# Patient Record
Sex: Female | Born: 1960
Health system: Southern US, Community
[De-identification: ages and names within clinical notes are randomized; demographics above are authoritative.]

## PROBLEM LIST (undated history)

## (undated) DIAGNOSIS — G4730 Sleep apnea, unspecified: Secondary | ICD-10-CM

## (undated) DIAGNOSIS — F419 Anxiety disorder, unspecified: Secondary | ICD-10-CM

## (undated) DIAGNOSIS — R51 Headache: Principal | ICD-10-CM

## (undated) DIAGNOSIS — T7840XA Allergy, unspecified, initial encounter: Secondary | ICD-10-CM

## (undated) DIAGNOSIS — G8929 Other chronic pain: Secondary | ICD-10-CM

## (undated) DIAGNOSIS — S329XXA Fracture of unspecified parts of lumbosacral spine and pelvis, initial encounter for closed fracture: Secondary | ICD-10-CM

## (undated) DIAGNOSIS — B019 Varicella without complication: Secondary | ICD-10-CM

## (undated) DIAGNOSIS — D702 Other drug-induced agranulocytosis: Secondary | ICD-10-CM

## (undated) DIAGNOSIS — S42009A Fracture of unspecified part of unspecified clavicle, initial encounter for closed fracture: Secondary | ICD-10-CM

## (undated) DIAGNOSIS — H6980 Other specified disorders of Eustachian tube, unspecified ear: Secondary | ICD-10-CM

## (undated) DIAGNOSIS — H6990 Unspecified Eustachian tube disorder, unspecified ear: Secondary | ICD-10-CM

## (undated) DIAGNOSIS — M791 Myalgia, unspecified site: Secondary | ICD-10-CM

## (undated) DIAGNOSIS — N951 Menopausal and female climacteric states: Secondary | ICD-10-CM

## (undated) HISTORY — DX: Fracture of unspecified part of unspecified clavicle, initial encounter for closed fracture: S42.009A

## (undated) HISTORY — DX: Other drug-induced agranulocytosis: D70.2

## (undated) HISTORY — DX: Anxiety disorder, unspecified: F41.9

## (undated) HISTORY — DX: Other chronic pain: G89.29

## (undated) HISTORY — DX: Other specified disorders of Eustachian tube, unspecified ear: H69.80

## (undated) HISTORY — DX: Fracture of unspecified parts of lumbosacral spine and pelvis, initial encounter for closed fracture: S32.9XXA

## (undated) HISTORY — DX: Myalgia, unspecified site: M79.10

## (undated) HISTORY — DX: Unspecified eustachian tube disorder, unspecified ear: H69.90

## (undated) HISTORY — PX: OTHER SURGICAL HISTORY: SHX169

## (undated) HISTORY — DX: Varicella without complication: B01.9

## (undated) HISTORY — DX: Sleep apnea, unspecified: G47.30

## (undated) HISTORY — PX: ANTERIOR CRUCIATE LIGAMENT REPAIR: SHX115

## (undated) HISTORY — DX: Allergy, unspecified, initial encounter: T78.40XA

## (undated) HISTORY — DX: Headache: R51

## (undated) HISTORY — DX: Menopausal and female climacteric states: N95.1

---

## 1999-09-07 ENCOUNTER — Ambulatory Visit (HOSPITAL_COMMUNITY): Admission: RE | Admit: 1999-09-07 | Discharge: 1999-09-07 | Payer: Self-pay | Admitting: Obstetrics and Gynecology

## 1999-12-22 ENCOUNTER — Encounter (INDEPENDENT_AMBULATORY_CARE_PROVIDER_SITE_OTHER): Payer: Self-pay

## 1999-12-22 ENCOUNTER — Ambulatory Visit (HOSPITAL_COMMUNITY): Admission: RE | Admit: 1999-12-22 | Discharge: 1999-12-22 | Payer: Self-pay | Admitting: Obstetrics and Gynecology

## 2000-10-26 ENCOUNTER — Other Ambulatory Visit: Admission: RE | Admit: 2000-10-26 | Discharge: 2000-10-26 | Payer: Self-pay | Admitting: Obstetrics and Gynecology

## 2003-09-10 ENCOUNTER — Other Ambulatory Visit: Admission: RE | Admit: 2003-09-10 | Discharge: 2003-09-10 | Payer: Self-pay | Admitting: Obstetrics and Gynecology

## 2003-09-16 ENCOUNTER — Encounter: Admission: RE | Admit: 2003-09-16 | Discharge: 2003-09-16 | Payer: Self-pay | Admitting: Obstetrics and Gynecology

## 2007-02-15 ENCOUNTER — Other Ambulatory Visit: Admission: RE | Admit: 2007-02-15 | Discharge: 2007-02-15 | Payer: Self-pay | Admitting: *Deleted

## 2008-08-18 ENCOUNTER — Encounter: Admission: RE | Admit: 2008-08-18 | Discharge: 2008-08-18 | Payer: Self-pay | Admitting: Obstetrics and Gynecology

## 2010-06-07 ENCOUNTER — Emergency Department (HOSPITAL_COMMUNITY): Admission: EM | Admit: 2010-06-07 | Discharge: 2010-06-07 | Payer: Self-pay | Admitting: Emergency Medicine

## 2010-10-23 ENCOUNTER — Encounter: Payer: Self-pay | Admitting: Obstetrics and Gynecology

## 2010-10-24 ENCOUNTER — Encounter: Payer: Self-pay | Admitting: Obstetrics and Gynecology

## 2011-02-18 NOTE — Op Note (Signed)
[facility]  Patient:    Diane Frye, Diane Frye                  MRN: [id number] Proc. Date: 12/22/99 Adm. Date:  [id number] Attending:  Frederich Balding                           Operative Report  PREOPERATIVE DIAGNOSIS:       Cyst of the left ovary.  POSTOPERATIVE DIAGNOSIS:      Cyst of the left ovary.  OPERATION:                    Laparoscopy with left ovarian cyst.  SURGEON:                      Juluis Mire, M.D.  ASSISTANT:  ANESTHESIA:                   General endotracheal anesthesia.  ESTIMATED BLOOD LOSS:         Minimal.  PACKS AND DRAINS:             None.  BLOOD REPLACED:               None.  COMPLICATIONS:                None.  INDICATIONS:                  Dictated in the history and physical.  DESCRIPTION OF PROCEDURE:     The patient was taken to the OR and placed in the  supine position.  After a satisfactory level of general endotracheal anesthesia was obtained, the patient was placed in the dorsal lithotomy position using the Allen stirrups.  Examination under anesthesia did reveal the uterus to be of normal size and shape.  There was a left adnexal fullness consistent with a known cyst.  A Hulka tenaculum was put in place and secured and the bladder was emptied by in-and-out catheterization.  The patient was draped out for laparoscopy.  A subumbilical incision was made with the knife.  The Veress needle was introduced into the abdominal cavity.  The abdomen was inflated with approximately 2 liters of carbon dioxide.  The operating laparoscope was then introduced.  There was no evidence of injury to adjacent organs.  A 5 mm trocar was put in place in the suprapubic area under direct visualization.  The appendix was normal.  The upper abdomen including liver, tip of the gallbladder, and both lateral gutters were clear.  The right tube and ovary were unremarkable.  The uterus was unremarkable. The cul-de-sac  was clear.  The left ovary was cystically enlarged.  On the outside we could see a larger cyst measuring 4 cm and the smaller cyst approximately 1 m in size.  There was no external excrescences or adhesions.  It appeared to be a  benign process.  It was interesting in that the majority of this cyst was against the antimesenteric border.  We brought the bipolar in and cauterized some of these superficial vessels.  We made an incision into the capsule of the ovary and tried to dissect the cyst free, but entered the cyst.  Clear fluid was obtained. Visualization of the internal aspect of the cyst lining revealed no evidence of  solid or excrescences.  At this point in time, we decided to excise part of  the  ovarian cortex along with the cyst and we did that including the smaller cyst and this was sent for pathological review.  We then were able to dissect the remaining of the cyst lining free and sent that for pathological review.  At the end of the procedure, the bipolar was brought in to bring about hemostasis.  With this we ad good hemostasis.  It seemed like the ovary was falling together and reapproximating nicely.  No active bleeding was noted.  Some irrigation was left in place to prevent adhesions.  Of note, we did put in a third trocar in the left lower quadrant after visualization of the epigastric vessels and this was used to help secure the ovary that was not mentioned earlier.  At this point in time we had ood hemostasis.  All trocars were removed.  The abdomen was deflated of carbon dioxide. Some irrigation was left in place.  The subumbilical incision was closed with interrupted subcuticulars of 4-0 Vicryl.  The suprapubic incision was closed with Steri-Strips.  The Hulka tenaculum was then removed.  The patient was taken out of the dorsal lithotomy position and once alert and extubated, she was transferred to the recovery room in good condition.  sponge, needle,  and instrument counts were correct by circulating nurse x 2. DD:  12/22/99 TD:  12/22/99 Job: [id number]

## 2011-04-19 ENCOUNTER — Ambulatory Visit: Payer: Self-pay | Admitting: Internal Medicine

## 2011-05-19 ENCOUNTER — Ambulatory Visit (INDEPENDENT_AMBULATORY_CARE_PROVIDER_SITE_OTHER): Payer: BC Managed Care – PPO | Admitting: Internal Medicine

## 2011-05-19 ENCOUNTER — Encounter: Payer: Self-pay | Admitting: Internal Medicine

## 2011-05-19 DIAGNOSIS — H6990 Unspecified Eustachian tube disorder, unspecified ear: Secondary | ICD-10-CM

## 2011-05-19 DIAGNOSIS — H6980 Other specified disorders of Eustachian tube, unspecified ear: Secondary | ICD-10-CM

## 2011-05-19 DIAGNOSIS — R51 Headache: Secondary | ICD-10-CM

## 2011-05-19 DIAGNOSIS — R519 Headache, unspecified: Secondary | ICD-10-CM

## 2011-05-19 DIAGNOSIS — M791 Myalgia, unspecified site: Secondary | ICD-10-CM

## 2011-05-19 DIAGNOSIS — N951 Menopausal and female climacteric states: Secondary | ICD-10-CM

## 2011-05-19 DIAGNOSIS — IMO0001 Reserved for inherently not codable concepts without codable children: Secondary | ICD-10-CM

## 2011-05-19 NOTE — Progress Notes (Signed)
Subjective:    Patient ID: Diane Frye, female    DOB: 1961-01-08, 50 y.o.   MRN: [id number]  HPI Diane Frye presents to establish for on-going continuity. She does suffer from depression and would like to change her anti-depressant. She is menopausal and she is suffering the climacteric. She does have a chronic sleep problem and there is a family h/o anxiety. She is the primary care-giver for a post-stroke patient, who was her fiance at the time of the event, leaving him with a right hemiplegia. He is heavy and she feels that she the pulling and moving him has lead to strain and injury: she has pain in right upper back, left lower back, buttock with radiation to the leg. She does follow with a chiropractor.   Past Medical History  Diagnosis Date  . Varicella   . Chronic headaches     2-3 times a month, frontal location, pressure like pain.  . Allergy     year round allergy with sinus drainage. Drugs of choice are decongestants  . Eustachian tube dysfunction     right ore than left  . Drug-induced leukopenia     antibiotic induced - 14 day hospitalization 1990's   Past Surgical History  Procedure Date  . Anterior cruciate ligament repair     right knee, secondary trauma w/ ACL repair at 50 y/o  . Fracture pelvis     age 33  . Ovarian cystectomy     left, laproscopically  . Gopo   . Closed reduction clavicle fracture    Family History  Problem Relation Age of Onset  . Hypertension Mother   . Hyperlipidemia Mother   . Arthritis Mother     ankle and hip  . Obesity Mother   . Heart disease Father     MI/CAD  . COPD Father     lung scarring/COPD  . Hypertension Father   . Hyperlipidemia Father   . Diabetes Father   . Heart disease Brother     MI/PCA-stent  . Hyperlipidemia Brother   . Hypertension Brother   . Obesity Brother   . Cancer Neg Hx    History   Social History  . Marital Status: Single    Spouse Name: N/A    Number of Children: N/A  . Years of  Education: N/A   Occupational History  . Not on file.   Social History Main Topics  . Smoking status: Never Smoker   . Smokeless tobacco: Never Used  . Alcohol Use: 1.0 oz/week    2 drink(s) per week  . Drug Use: No  . Sexually Active: No   Other Topics Concern  . Not on file   Social History Narrative   HSG. Never married. Work - Museum/gallery exhibitions officer, Pensions consultant in the past, Government social research officer. No history of physical or sexually abused. She has "fiance" living with her.       Review of Systems Review of Systems  Constitutional:  Positive forlow grade fever, no chills, activity change and unexpected weight change up 40 lbs  HEENT:  Negative for hearing loss, occasional ear pain, frequent congestion, neck stiffness and postnasal drip. Frequent sinus infections. Negative for sore throat or swallowing problems. Negative for dental complaints - partial bridge constructed.   Eyes: Negative for vision loss or change in visual acuity.  Respiratory: Negative for chest tightness and wheezing.   Cardiovascular: Negative for chest pain and palpitation. No decreased exercise tolerance Gastrointestinal:Positive change in bowel habit- less frequent  BMs. Positive for  bloating or gas. No reflux or indigestion Genitourinary: Negative for urgency, frequency, flank pain and difficulty urinating. No incontinence Musculoskeletal: Positive for myalgias, back pain, arthralgias. Will have hand pain with overuse along with weakness. NO gait problem but will have a limp.  Neurological: Positive  for light headedness which she associates with neck pain/strain. No tremors, weakness. Positive for  headaches.  Hematological: Negative for adenopathy.  Psychiatric/Behavioral: Negative for behavioral problems and dysphoric mood.       Objective:   Physical Exam Vitals reviewed - BP excellent, overweight Gen'l - heavyset white woman in no distress HEENT - C&S clear, PERRLA Neck- supple Pul - normal  respirations, lungs clear Cor - 2+ radial pulse, RRR MSK - MAE without limitation, normal gait.       Assessment & Plan:  In summary - a nic ewoman who presents to establish for care. She has many medical and musculo-skeletal issues. She will return in the near future for a more thorough evaluation and exam. No new medications at this point. She will obtain old labs and/or imaging studies for review prior to her next visit.

## 2011-05-22 ENCOUNTER — Encounter: Payer: Self-pay | Admitting: Internal Medicine

## 2011-05-22 DIAGNOSIS — N951 Menopausal and female climacteric states: Secondary | ICD-10-CM | POA: Insufficient documentation

## 2011-05-22 DIAGNOSIS — M791 Myalgia, unspecified site: Secondary | ICD-10-CM | POA: Insufficient documentation

## 2011-05-22 DIAGNOSIS — H6980 Other specified disorders of Eustachian tube, unspecified ear: Secondary | ICD-10-CM | POA: Insufficient documentation

## 2011-05-22 DIAGNOSIS — G8929 Other chronic pain: Secondary | ICD-10-CM | POA: Insufficient documentation

## 2011-05-22 NOTE — Assessment & Plan Note (Signed)
Patient with concern for symptoms of climacteric including malaise and fatigue. She has had a saliva analysis revealing estradiol in normal range, progesterone in norma range, testosterone normal range, DHEAS normal range and cortisol in normal range. She has an explanation sheet to accompany these labs. I have told her that all I can say is that these values are in normal range. She is encouraged to discuss hormonal issues with her gynecologist.  Plan - refer back to gyn           If she insists serum hormone levels, FSH, LH, estrogen and testosterone levels can be ordered but will need interpretation by gyn or endocrinologist.           For symptoms of hormone deprivation she can discuss replacement options with gyn or IM.

## 2011-05-22 NOTE — Assessment & Plan Note (Signed)
Diane Frye c/o muscle pain right neck, scapular, left arm and hand regions. 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 She does seen a Land.  Plan - full MSK exam at next visit.           May benefit from myofascial massage/PT

## 2011-08-31 ENCOUNTER — Other Ambulatory Visit: Payer: Self-pay | Admitting: Obstetrics and Gynecology

## 2011-08-31 DIAGNOSIS — R928 Other abnormal and inconclusive findings on diagnostic imaging of breast: Secondary | ICD-10-CM

## 2011-09-13 ENCOUNTER — Ambulatory Visit
Admission: RE | Admit: 2011-09-13 | Discharge: 2011-09-13 | Disposition: A | Discharge status: Home / Self Care | Payer: BC Managed Care – PPO | Source: Ambulatory Visit | Attending: Obstetrics and Gynecology | Admitting: Obstetrics and Gynecology

## 2011-09-13 DIAGNOSIS — R928 Other abnormal and inconclusive findings on diagnostic imaging of breast: Secondary | ICD-10-CM

## 2011-09-16 ENCOUNTER — Other Ambulatory Visit: Payer: Self-pay | Admitting: Obstetrics and Gynecology

## 2011-09-16 DIAGNOSIS — N6489 Other specified disorders of breast: Secondary | ICD-10-CM

## 2011-09-16 DIAGNOSIS — R928 Other abnormal and inconclusive findings on diagnostic imaging of breast: Secondary | ICD-10-CM

## 2011-09-24 ENCOUNTER — Inpatient Hospital Stay: Admission: RE | Admit: 2011-09-24 | Payer: BC Managed Care – PPO | Source: Ambulatory Visit

## 2011-09-24 ENCOUNTER — Ambulatory Visit
Admission: RE | Admit: 2011-09-24 | Discharge: 2011-09-24 | Disposition: A | Discharge status: Home / Self Care | Payer: BC Managed Care – PPO | Source: Ambulatory Visit | Attending: Obstetrics and Gynecology | Admitting: Obstetrics and Gynecology

## 2011-09-24 DIAGNOSIS — N6489 Other specified disorders of breast: Secondary | ICD-10-CM

## 2011-09-24 DIAGNOSIS — R928 Other abnormal and inconclusive findings on diagnostic imaging of breast: Secondary | ICD-10-CM

## 2011-09-24 MED ORDER — GADOBENATE DIMEGLUMINE 529 MG/ML IV SOLN
15.0000 mL | Freq: Once | INTRAVENOUS | Status: AC | PRN
  Administered 2011-09-24: 15 mL via INTRAVENOUS

## 2011-09-28 ENCOUNTER — Other Ambulatory Visit: Payer: BC Managed Care – PPO

## 2011-09-30 ENCOUNTER — Other Ambulatory Visit: Payer: Self-pay | Admitting: Neurosurgery

## 2011-09-30 DIAGNOSIS — M545 Low back pain: Secondary | ICD-10-CM

## 2011-10-01 ENCOUNTER — Ambulatory Visit
Admission: RE | Admit: 2011-10-01 | Discharge: 2011-10-01 | Disposition: A | Discharge status: Home / Self Care | Payer: BC Managed Care – PPO | Source: Ambulatory Visit | Attending: Neurosurgery | Admitting: Neurosurgery

## 2011-10-01 DIAGNOSIS — M545 Low back pain: Secondary | ICD-10-CM

## 2012-04-10 ENCOUNTER — Other Ambulatory Visit: Payer: Self-pay | Admitting: Obstetrics and Gynecology

## 2012-04-10 DIAGNOSIS — N6009 Solitary cyst of unspecified breast: Secondary | ICD-10-CM

## 2012-08-24 ENCOUNTER — Other Ambulatory Visit: Payer: Self-pay | Admitting: Obstetrics and Gynecology

## 2012-08-24 ENCOUNTER — Ambulatory Visit
Admission: RE | Admit: 2012-08-24 | Discharge: 2012-08-24 | Disposition: A | Discharge status: Home / Self Care | Payer: BC Managed Care – PPO | Source: Ambulatory Visit | Attending: Obstetrics and Gynecology | Admitting: Obstetrics and Gynecology

## 2012-08-24 DIAGNOSIS — N6009 Solitary cyst of unspecified breast: Secondary | ICD-10-CM

## 2012-09-28 ENCOUNTER — Encounter (HOSPITAL_COMMUNITY): Payer: Self-pay | Admitting: *Deleted

## 2012-10-01 ENCOUNTER — Encounter (HOSPITAL_COMMUNITY): Payer: Self-pay | Admitting: Pharmacist

## 2012-10-01 NOTE — H&P (Signed)
  51 yo postmenopausal patient presents for LEEP and or conization of cervix.  Colposcopically directed biopsies revealed sever dysplasia.  colpo not satisfactory. Now for above.  Allergies:  codeine and penicillins Meds:  See list PMH:  Hypertension under management.           No Surgery noted  SH:  No tobacco or alcohol use  FH:  [place]  ROS:  [place]  PE:AF VSS       Lungs clear       CVS:  RRR no murmurs       Abdomin benign       Pelvic normal ext genitalia  Vagina clear  Cervix with nabothian cyst       Uterus NSSC adnexa clear        Neuro WNL  IMP:  High grade dysplasia of crevix  Plan:  LEEP or possible conization of the cerivx           Risks discussed including infection.  The risk of hemorrhage that could require transfusion with             Risk of AIDS and hepatitis.    Excessive bleeding could require hysterectomy.  Risk of DVT and             PE,   Risk of cervical stenosis.

## 2012-10-02 ENCOUNTER — Encounter (HOSPITAL_COMMUNITY)
Admission: RE | Disposition: A | Discharge status: Home / Self Care | Payer: Self-pay | Source: Ambulatory Visit | Attending: Obstetrics and Gynecology

## 2012-10-02 ENCOUNTER — Ambulatory Visit (HOSPITAL_COMMUNITY): Payer: BC Managed Care – PPO | Admitting: Anesthesiology

## 2012-10-02 ENCOUNTER — Encounter (HOSPITAL_COMMUNITY): Payer: Self-pay | Admitting: *Deleted

## 2012-10-02 ENCOUNTER — Encounter (HOSPITAL_COMMUNITY): Payer: Self-pay | Admitting: Anesthesiology

## 2012-10-02 ENCOUNTER — Ambulatory Visit (HOSPITAL_COMMUNITY)
Admission: RE | Admit: 2012-10-02 | Discharge: 2012-10-02 | Disposition: A | Discharge status: Home / Self Care | Payer: BC Managed Care – PPO | Source: Ambulatory Visit | Attending: Obstetrics and Gynecology | Admitting: Obstetrics and Gynecology

## 2012-10-02 DIAGNOSIS — N879 Dysplasia of cervix uteri, unspecified: Secondary | ICD-10-CM | POA: Diagnosis present

## 2012-10-02 DIAGNOSIS — Z9889 Other specified postprocedural states: Secondary | ICD-10-CM

## 2012-10-02 DIAGNOSIS — D069 Carcinoma in situ of cervix, unspecified: Secondary | ICD-10-CM | POA: Insufficient documentation

## 2012-10-02 DIAGNOSIS — N841 Polyp of cervix uteri: Secondary | ICD-10-CM | POA: Insufficient documentation

## 2012-10-02 HISTORY — PX: LEEP: SHX91

## 2012-10-02 LAB — CBC
HCT: 39 % (ref 36.0–46.0)
Hemoglobin: 12.8 g/dL (ref 12.0–15.0)
MCH: 28.3 pg (ref 26.0–34.0)
MCV: 86.1 fL (ref 78.0–100.0)
RBC: 4.53 MIL/uL (ref 3.87–5.11)

## 2012-10-02 LAB — HCG, SERUM, QUALITATIVE: Preg, Serum: NEGATIVE

## 2012-10-02 SURGERY — LEEP (LOOP ELECTROSURGICAL EXCISION PROCEDURE)
Anesthesia: Monitor Anesthesia Care | Site: Vagina | Wound class: Clean Contaminated

## 2012-10-02 MED ORDER — DEXAMETHASONE SODIUM PHOSPHATE 4 MG/ML IJ SOLN
INTRAMUSCULAR | Status: DC | PRN
  Administered 2012-10-02: 10 mg via INTRAVENOUS

## 2012-10-02 MED ORDER — FENTANYL CITRATE 0.05 MG/ML IJ SOLN
INTRAMUSCULAR | Status: AC
  Administered 2012-10-02: 50 ug via INTRAVENOUS
  Filled 2012-10-02: qty 2

## 2012-10-02 MED ORDER — FENTANYL CITRATE 0.05 MG/ML IJ SOLN: 25.0000 ug | INTRAMUSCULAR | Status: DC | PRN

## 2012-10-02 MED ORDER — KETOROLAC TROMETHAMINE 30 MG/ML IJ SOLN
INTRAMUSCULAR | Status: DC | PRN
  Administered 2012-10-02: 30 mg via INTRAVENOUS

## 2012-10-02 MED ORDER — DEXAMETHASONE SODIUM PHOSPHATE 10 MG/ML IJ SOLN
INTRAMUSCULAR | Status: AC
  Filled 2012-10-02: qty 1

## 2012-10-02 MED ORDER — ONDANSETRON HCL 4 MG/2ML IJ SOLN
INTRAMUSCULAR | Status: DC | PRN
  Administered 2012-10-02: 4 mg via INTRAVENOUS

## 2012-10-02 MED ORDER — FERRIC SUBSULFATE SOLN
Status: DC | PRN
  Administered 2012-10-02: 1 via TOPICAL

## 2012-10-02 MED ORDER — CLINDAMYCIN PHOSPHATE 900 MG/50ML IV SOLN
900.0000 mg | INTRAVENOUS | Status: AC
  Administered 2012-10-02: 900 mg via INTRAVENOUS
  Filled 2012-10-02: qty 50

## 2012-10-02 MED ORDER — CIPROFLOXACIN IN D5W 400 MG/200ML IV SOLN
400.0000 mg | INTRAVENOUS | Status: AC
  Administered 2012-10-02: 400 mg via INTRAVENOUS
  Filled 2012-10-02: qty 200

## 2012-10-02 MED ORDER — ONDANSETRON HCL 4 MG/2ML IJ SOLN
INTRAMUSCULAR | Status: AC
  Filled 2012-10-02: qty 2

## 2012-10-02 MED ORDER — FENTANYL CITRATE 0.05 MG/ML IJ SOLN
25.0000 ug | INTRAMUSCULAR | Status: DC | PRN
  Administered 2012-10-02 (×3): 50 ug via INTRAVENOUS

## 2012-10-02 MED ORDER — DIPHENHYDRAMINE HCL 50 MG/ML IJ SOLN
INTRAMUSCULAR | Status: AC
  Administered 2012-10-02: 25 mg via INTRAVENOUS
  Filled 2012-10-02: qty 1

## 2012-10-02 MED ORDER — PROMETHAZINE HCL 25 MG/ML IJ SOLN
12.5000 mg | Freq: Once | INTRAMUSCULAR | Status: AC
  Administered 2012-10-02: 12.5 mg via INTRAVENOUS

## 2012-10-02 MED ORDER — FENTANYL CITRATE 0.05 MG/ML IJ SOLN
INTRAMUSCULAR | Status: DC | PRN
  Administered 2012-10-02 (×3): 50 ug via INTRAVENOUS
  Administered 2012-10-02: 100 ug via INTRAVENOUS

## 2012-10-02 MED ORDER — PROPOFOL 10 MG/ML IV EMUL
INTRAVENOUS | Status: DC | PRN
  Administered 2012-10-02: 170 mg via INTRAVENOUS

## 2012-10-02 MED ORDER — FENTANYL CITRATE 0.05 MG/ML IJ SOLN
INTRAMUSCULAR | Status: AC
  Filled 2012-10-02: qty 5

## 2012-10-02 MED ORDER — LIDOCAINE HCL (CARDIAC) 20 MG/ML IV SOLN
INTRAVENOUS | Status: AC
  Filled 2012-10-02: qty 5

## 2012-10-02 MED ORDER — MIDAZOLAM HCL 5 MG/5ML IJ SOLN
INTRAMUSCULAR | Status: DC | PRN
  Administered 2012-10-02: 2 mg via INTRAVENOUS

## 2012-10-02 MED ORDER — LIDOCAINE HCL (CARDIAC) 20 MG/ML IV SOLN
INTRAVENOUS | Status: DC | PRN
  Administered 2012-10-02: 60 mg via INTRAVENOUS

## 2012-10-02 MED ORDER — DIPHENHYDRAMINE HCL 50 MG/ML IJ SOLN
25.0000 mg | Freq: Once | INTRAMUSCULAR | Status: AC
  Administered 2012-10-02: 25 mg via INTRAVENOUS

## 2012-10-02 MED ORDER — PROPOFOL 10 MG/ML IV EMUL
INTRAVENOUS | Status: AC
  Filled 2012-10-02: qty 20

## 2012-10-02 MED ORDER — KETOROLAC TROMETHAMINE 30 MG/ML IJ SOLN
INTRAMUSCULAR | Status: AC
  Filled 2012-10-02: qty 1

## 2012-10-02 MED ORDER — LACTATED RINGERS IV SOLN
INTRAVENOUS | Status: DC
  Administered 2012-10-02 (×4): via INTRAVENOUS

## 2012-10-02 MED ORDER — PROMETHAZINE HCL 25 MG/ML IJ SOLN
INTRAMUSCULAR | Status: AC
  Administered 2012-10-02: 12.5 mg via INTRAVENOUS
  Filled 2012-10-02: qty 1

## 2012-10-02 MED ORDER — LIDOCAINE-EPINEPHRINE 1 %-1:100000 IJ SOLN
INTRAMUSCULAR | Status: DC | PRN
  Administered 2012-10-02: 18 mL

## 2012-10-02 MED ORDER — MIDAZOLAM HCL 2 MG/2ML IJ SOLN
INTRAMUSCULAR | Status: AC
  Filled 2012-10-02: qty 2

## 2012-10-02 SURGICAL SUPPLY — 31 items
APPLICATOR COTTON TIP 6IN STRL (MISCELLANEOUS) ×3 IMPLANT
BLADE SURG 11 STRL SS (BLADE) ×3 IMPLANT
CLOTH BEACON ORANGE TIMEOUT ST (SAFETY) ×3 IMPLANT
CONTAINER PREFILL 10% NBF 60ML (FORM) ×6 IMPLANT
COUNTER NEEDLE 1200 MAGNETIC (NEEDLE) ×3 IMPLANT
DRESSING TELFA 8X3 (GAUZE/BANDAGES/DRESSINGS) ×3 IMPLANT
ELECT LOOP LEEP RND 15X12 GRN (CUTTING LOOP) ×3
ELECT REM PT RETURN 9FT ADLT (ELECTROSURGICAL) ×3
ELECTRODE LOOP LP RND 15X12GRN (CUTTING LOOP) ×2 IMPLANT
ELECTRODE REM PT RTRN 9FT ADLT (ELECTROSURGICAL) ×2 IMPLANT
GAUZE SPONGE 4X4 16PLY XRAY LF (GAUZE/BANDAGES/DRESSINGS) IMPLANT
GLOVE BIO SURGEON STRL SZ7 (GLOVE) ×6 IMPLANT
GOWN STRL REIN XL XLG (GOWN DISPOSABLE) ×6 IMPLANT
HEMOSTAT SURGICEL 2X14 (HEMOSTASIS) IMPLANT
NEEDLE SPNL 22GX3.5 QUINCKE BK (NEEDLE) ×3 IMPLANT
NS IRRIG 1000ML POUR BTL (IV SOLUTION) IMPLANT
PACK VAGINAL MINOR WOMEN LF (CUSTOM PROCEDURE TRAY) ×3 IMPLANT
PAD OB MATERNITY 4.3X12.25 (PERSONAL CARE ITEMS) ×3 IMPLANT
PENCIL BUTTON HOLSTER BLD 10FT (ELECTRODE) ×3 IMPLANT
SCOPETTES 8  STERILE (MISCELLANEOUS) ×2
SCOPETTES 8 STERILE (MISCELLANEOUS) ×4 IMPLANT
SPONGE SURGIFOAM ABS GEL 12-7 (HEMOSTASIS) IMPLANT
SUT CHROMIC 1MO 4 18 CR8 (SUTURE) ×3 IMPLANT
SYR 20CC LL (SYRINGE) ×3 IMPLANT
SYR CONTROL 10ML LL (SYRINGE) ×3 IMPLANT
SYR TB 1ML 27GX1/2 SAFE (SYRINGE) ×2 IMPLANT
SYR TB 1ML 27GX1/2 SAFETY (SYRINGE) ×1
TOWEL OR 17X24 6PK STRL BLUE (TOWEL DISPOSABLE) ×6 IMPLANT
TUBING NON-CON 1/4 X 20 CONN (TUBING) ×3 IMPLANT
WATER STERILE IRR 1000ML POUR (IV SOLUTION) ×3 IMPLANT
YANKAUER SUCT BULB TIP NO VENT (SUCTIONS) ×3 IMPLANT

## 2012-10-02 NOTE — Transfer of Care (Signed)
Immediate Anesthesia Transfer of Care Note  Patient: Diane Frye  Procedure(s) Performed: Procedure(s) (LRB) with comments: LOOP ELECTROSURGICAL EXCISION PROCEDURE (LEEP) (N/A)  Patient Location: PACU  Anesthesia Type:General  Level of Consciousness: awake, alert , oriented and patient cooperative  Airway & Oxygen Therapy: Patient Spontanous Breathing and Patient connected to nasal cannula oxygen  Post-op Assessment: Report given to PACU RN and Post -op Vital signs reviewed and stable  Post vital signs: Reviewed and stable  Complications: No apparent anesthesia complications

## 2012-10-02 NOTE — H&P (Signed)
  History and physical exam unchanged 

## 2012-10-02 NOTE — Anesthesia Postprocedure Evaluation (Signed)
  Anesthesia Post-op Note  Patient: Diane Frye  Procedure(s) Performed: Procedure(s) (LRB) with comments: LOOP ELECTROSURGICAL EXCISION PROCEDURE (LEEP) (N/A)  Patient Location: PACU  Anesthesia Type:MAC  Level of Consciousness: awake, alert  and oriented  Airway and Oxygen Therapy: Patient Spontanous Breathing  Post-op Pain: none  Post-op Assessment: Post-op Vital signs reviewed, Patient's Cardiovascular Status Stable, Respiratory Function Stable, Patent Airway, No signs of Nausea or vomiting and Adequate PO intake  Post-op Vital Signs: Reviewed and stable  Complications: No apparent anesthesia complications

## 2012-10-02 NOTE — Preoperative (Signed)
Beta Blockers   Reason not to administer Beta Blockers:Not Applicable 

## 2012-10-02 NOTE — Anesthesia Preprocedure Evaluation (Signed)
Anesthesia Evaluation  Patient identified by MRN, date of birth, ID band Patient awake    Reviewed: Allergy & Precautions, H&P , Patient's Chart, lab work & pertinent test results, reviewed documented beta blocker date and time   Airway Mallampati: II TM Distance: >3 FB Neck ROM: full    Dental No notable dental hx.    Pulmonary  breath sounds clear to auscultation  Pulmonary exam normal       Cardiovascular Rhythm:regular Rate:Normal     Neuro/Psych    GI/Hepatic   Endo/Other    Renal/GU      Musculoskeletal   Abdominal   Peds  Hematology   Anesthesia Other Findings   Reproductive/Obstetrics                           Anesthesia Physical Anesthesia Plan  ASA: II  Anesthesia Plan: General and MAC   Post-op Pain Management:    Induction: Intravenous  Airway Management Planned: LMA and Natural Airway  Additional Equipment:   Intra-op Plan:   Post-operative Plan:   Informed Consent: I have reviewed the patients History and Physical, chart, labs and discussed the procedure including the risks, benefits and alternatives for the proposed anesthesia with the patient or authorized representative who has indicated his/her understanding and acceptance.   Dental Advisory Given  Plan Discussed with: CRNA and Surgeon  Anesthesia Plan Comments: (  Discussed  general anesthesia, including possible nausea, instrumentation of airway, sore throat,pulmonary aspiration, etc. I asked if the were any outstanding questions, or  concerns before we proceeded. )        Anesthesia Quick Evaluation

## 2012-10-02 NOTE — Brief Op Note (Signed)
10/02/2012  1:34 PM  PATIENT:  Diane Frye  51 y.o. female  PRE-OPERATIVE DIAGNOSIS:  high grade dysplasia  POST-OPERATIVE DIAGNOSIS:  high grade dysplasia  PROCEDURE:  Procedure(s) (LRB) with comments: LOOP ELECTROSURGICAL EXCISION PROCEDURE (LEEP) (N/A)  SURGEON:  Surgeon(s) and Role:    * Juluis Mire, MD - Primary  PHYSICIAN ASSISTANT:   ASSISTANTS: none   ANESTHESIA:   local and general  EBL:     BLOOD ADMINISTERED:none  DRAINS: none   LOCAL MEDICATIONS USED:  XYLOCAINE eiith epi  and Amount: 18 ml  SPECIMEN:  Source of Specimen:  cerivcal cone and endocervical button  DISPOSITION OF SPECIMEN:  PATHOLOGY  COUNTS:  YES  TOURNIQUET:  * No tourniquets in log *  DICTATION: .Other Dictation: Dictation Number [id number]  PLAN OF CARE: Discharge to home after PACU  PATIENT DISPOSITION:  PACU - hemodynamically stable.   Delay start of Pharmacological VTE agent (>24hrs) due to surgical blood loss or risk of bleeding: not applicable

## 2012-10-02 NOTE — Op Note (Signed)
Patient name  aydin, cavalieri DICTATION#  [id number] CSN# [id number]  Juluis Mire, MD 10/02/2012 1:35 PM

## 2012-10-03 ENCOUNTER — Encounter (HOSPITAL_COMMUNITY): Payer: Self-pay | Admitting: Obstetrics and Gynecology

## 2012-10-03 NOTE — Op Note (Signed)
NAME:  Diane Frye, Diane Frye NO.:  [id number]  MEDICAL RECORD NO.:  [id number]  LOCATION:  [facility]                          FACILITY:  [facility]  PHYSICIAN:  Juluis Mire, M.D.   DATE OF BIRTH:  05-15-1961  DATE OF PROCEDURE:  10/02/2012 DATE OF DISCHARGE:  10/02/2012                              OPERATIVE REPORT   PREOPERATIVE DIAGNOSIS:  Severe cervical dysplasia with unsatisfactory colposcopy.  POSTOPERATIVE DIAGNOSIS:  Severe cervical dysplasia with unsatisfactory colposcopy.  OPERATIVE PROCEDURE:  Paracervical block with loop electrical excision procedure.  SURGEON:  Juluis Mire, M.D.  ANESTHESIA:  General with paracervical block.  BLOOD LOSS:  Minimal.  PACKS:  None.  DRAINS:  None.  INTRAOPERATIVE BLOOD PLACED:  None.  COMPLICATIONS:  None.  INDICATIONS:  As dictated in the history and physical.  PROCEDURE:  The patient was taken to the OR and placed in supine position.  After satisfactory level of general anesthesia obtained, the patient was placed in dorsal lithotomy position in the  [place] stirrups. The perineum and vagina prepped out with Betadine and draped sterile field.  A speculum was placed in vaginal vault.  An apparent endocervical polyp was noted.  The cervix was instilled with 1% Xylocaine with epinephrine.  We used the middle loop.  We first excised the polyp and then we did a superficial excision of the ectocervix and endocervical.  All this was sent for pathology.  We brought out hemostasis using cautery and [place].  We had good hemostasis and no signs of any complications.  Speculum was then removed.  The patient was taken out of the supine position once alert and extubated, transferred to recovery in good condition.  Sponge, instrument, and needle count was correct by circulating nurse x2.  The patient tolerated procedure well.     Juluis Mire, M.D.     JSM/MEDQ  D:  10/02/2012  T:  10/03/2012  Job:  [id number]

## 2013-09-10 ENCOUNTER — Encounter: Payer: Self-pay | Admitting: Gastroenterology

## 2013-09-17 ENCOUNTER — Other Ambulatory Visit: Payer: Self-pay | Admitting: Obstetrics and Gynecology

## 2013-09-17 DIAGNOSIS — R928 Other abnormal and inconclusive findings on diagnostic imaging of breast: Secondary | ICD-10-CM

## 2013-09-27 ENCOUNTER — Other Ambulatory Visit: Payer: BC Managed Care – PPO

## 2013-10-01 ENCOUNTER — Other Ambulatory Visit: Payer: BC Managed Care – PPO

## 2013-10-02 ENCOUNTER — Ambulatory Visit
Admission: RE | Admit: 2013-10-02 | Discharge: 2013-10-02 | Disposition: A | Discharge status: Home / Self Care | Payer: BC Managed Care – PPO | Source: Ambulatory Visit | Attending: Obstetrics and Gynecology | Admitting: Obstetrics and Gynecology

## 2013-10-02 DIAGNOSIS — R928 Other abnormal and inconclusive findings on diagnostic imaging of breast: Secondary | ICD-10-CM

## 2013-10-04 ENCOUNTER — Other Ambulatory Visit: Payer: BC Managed Care – PPO

## 2013-11-06 ENCOUNTER — Encounter: Payer: BC Managed Care – PPO | Admitting: Gastroenterology

## 2015-02-25 ENCOUNTER — Other Ambulatory Visit: Payer: Self-pay | Admitting: Obstetrics and Gynecology

## 2015-02-26 LAB — CYTOLOGY - PAP

## 2015-10-26 ENCOUNTER — Emergency Department (HOSPITAL_COMMUNITY)
Admission: EM | Admit: 2015-10-26 | Discharge: 2015-10-27 | Disposition: A | Discharge status: Home / Self Care | Payer: Managed Care, Other (non HMO) | Attending: Emergency Medicine | Admitting: Emergency Medicine

## 2015-10-26 ENCOUNTER — Encounter (HOSPITAL_COMMUNITY): Payer: Self-pay | Admitting: Emergency Medicine

## 2015-10-26 ENCOUNTER — Emergency Department (HOSPITAL_COMMUNITY): Payer: Managed Care, Other (non HMO)

## 2015-10-26 DIAGNOSIS — R42 Dizziness and giddiness: Secondary | ICD-10-CM | POA: Insufficient documentation

## 2015-10-26 DIAGNOSIS — Z862 Personal history of diseases of the blood and blood-forming organs and certain disorders involving the immune mechanism: Secondary | ICD-10-CM | POA: Insufficient documentation

## 2015-10-26 DIAGNOSIS — Z8619 Personal history of other infectious and parasitic diseases: Secondary | ICD-10-CM | POA: Diagnosis not present

## 2015-10-26 DIAGNOSIS — G8929 Other chronic pain: Secondary | ICD-10-CM | POA: Diagnosis not present

## 2015-10-26 DIAGNOSIS — Z880 Allergy status to penicillin: Secondary | ICD-10-CM | POA: Diagnosis not present

## 2015-10-26 DIAGNOSIS — J0110 Acute frontal sinusitis, unspecified: Secondary | ICD-10-CM | POA: Insufficient documentation

## 2015-10-26 DIAGNOSIS — R1110 Vomiting, unspecified: Secondary | ICD-10-CM | POA: Insufficient documentation

## 2015-10-26 DIAGNOSIS — Z79899 Other long term (current) drug therapy: Secondary | ICD-10-CM | POA: Diagnosis not present

## 2015-10-26 DIAGNOSIS — Z8742 Personal history of other diseases of the female genital tract: Secondary | ICD-10-CM | POA: Diagnosis not present

## 2015-10-26 DIAGNOSIS — R202 Paresthesia of skin: Secondary | ICD-10-CM | POA: Diagnosis not present

## 2015-10-26 LAB — I-STAT TROPONIN, ED: TROPONIN I, POC: 0 ng/mL (ref 0.00–0.08)

## 2015-10-26 LAB — URINE MICROSCOPIC-ADD ON

## 2015-10-26 LAB — CBC
HEMATOCRIT: 37.7 % (ref 36.0–46.0)
HEMOGLOBIN: 12.4 g/dL (ref 12.0–15.0)
MCH: 29.4 pg (ref 26.0–34.0)
MCHC: 32.9 g/dL (ref 30.0–36.0)
MCV: 89.3 fL (ref 78.0–100.0)
Platelets: 394 10*3/uL (ref 150–400)
RBC: 4.22 MIL/uL (ref 3.87–5.11)
RDW: 13.6 % (ref 11.5–15.5)
WBC: 9.7 10*3/uL (ref 4.0–10.5)

## 2015-10-26 LAB — LIPASE, BLOOD: Lipase: 32 U/L (ref 11–51)

## 2015-10-26 LAB — COMPREHENSIVE METABOLIC PANEL
ALBUMIN: 4.6 g/dL (ref 3.5–5.0)
ALK PHOS: 73 U/L (ref 38–126)
ALT: 12 U/L — ABNORMAL LOW (ref 14–54)
ANION GAP: 9 (ref 5–15)
AST: 17 U/L (ref 15–41)
BILIRUBIN TOTAL: 0.4 mg/dL (ref 0.3–1.2)
BUN: 23 mg/dL — AB (ref 6–20)
CALCIUM: 9.8 mg/dL (ref 8.9–10.3)
CO2: 23 mmol/L (ref 22–32)
Chloride: 107 mmol/L (ref 101–111)
Creatinine, Ser: 0.71 mg/dL (ref 0.44–1.00)
GFR calc Af Amer: >60 mL/min (ref >60)
GFR calc non Af Amer: >60 mL/min (ref >60)
GLUCOSE: 118 mg/dL — AB (ref 65–99)
Potassium: 4.4 mmol/L (ref 3.5–5.1)
SODIUM: 139 mmol/L (ref 135–145)
Total Protein: 7.8 g/dL (ref 6.5–8.1)

## 2015-10-26 LAB — URINALYSIS, ROUTINE W REFLEX MICROSCOPIC
BILIRUBIN URINE: NEGATIVE
Glucose, UA: NEGATIVE mg/dL
KETONES UR: NEGATIVE mg/dL
Nitrite: NEGATIVE
PH: 7.5 (ref 5.0–8.0)
Protein, ur: NEGATIVE mg/dL
SPECIFIC GRAVITY, URINE: 1.009 (ref 1.005–1.030)

## 2015-10-26 MED ORDER — AMOXICILLIN-POT CLAVULANATE 875-125 MG PO TABS: 1.0000 | ORAL_TABLET | Freq: Two times a day (BID) | ORAL | Status: DC

## 2015-10-26 MED ORDER — SODIUM CHLORIDE 0.9 % IV BOLUS (SEPSIS)
1000.0000 mL | Freq: Once | INTRAVENOUS | Status: AC
  Administered 2015-10-26: 1000 mL via INTRAVENOUS

## 2015-10-26 MED ORDER — FLUORESCEIN SODIUM 1 MG OP STRP
1.0000 | ORAL_STRIP | Freq: Once | OPHTHALMIC | Status: AC
  Administered 2015-10-26: 1 via OPHTHALMIC
  Filled 2015-10-26: qty 1

## 2015-10-26 MED ORDER — TETRACAINE HCL 0.5 % OP SOLN
1.0000 [drp] | Freq: Once | OPHTHALMIC | Status: AC
  Administered 2015-10-26: 1 [drp] via OPHTHALMIC
  Filled 2015-10-26: qty 4

## 2015-10-26 NOTE — Discharge Instructions (Signed)
Please follow-up with her primary care provider for further evaluation and management. If any new or worsening signs or symptoms present please return to the emergency room for further evaluation.

## 2015-10-26 NOTE — ED Notes (Signed)
Pt c/o dizziness, emesis, and abdominal pain x 4 days. Pt sts "it started with the flu in my chest and now I seem to be over with that but I'm vomiting and have pain on the L side of my stomach." Pt also c/o sciatica in L hip. Pt also sts "I have heart problems on my moms side." Pt sts "looking at my symptoms it could be the flu with a heart problem or anxiety with the flu." Pt also c/o dry mouth. A&Ox4 and ambulatory.

## 2015-10-26 NOTE — ED Provider Notes (Signed)
CSN: [id number]     Arrival date & time 10/26/15  1505 History   First MD Initiated Contact with Patient 10/26/15 1652     Chief Complaint  Patient presents with  . Emesis  . Abdominal Pain  . Dizziness   HPI   55 year old female presents with numerous complaints today. Patient reports that approximately 2 weeks ago she had what she described as the flu with upper respiratory symptoms, cough. She reports that she seemed to be getting better but over the last few days started feeling worse again. She reports that she is here today because she had pressure behind her right eye, she denies headache, reports this is a "pulling sensation. She reports that this morning after attempting to vomit she felt "dizzy as if she was going to pass out. She reports dizziness has persisted, with the addition of cotton mouth. Patient reports she also had an episode of tingling in her left arm that has since resolved. Patient also reports that she's having left sciatic pain, reports this is chronic. Patient notes blurry vision out of her right eye, but denies decrease in visual acuity. Patient also reports that she's feeling "sick on her stomach," with 5 episodes of vomiting today and loose stools but no profuse diarrhea. Patient additionally notes a productive cough with "brown sputum and ". Patient denies any history of DVT or PE, lower extremity swelling or edema, recent prolonged immobilization, active malignancy, or any other risk factors for DVT or PE.  Pt continues to add complaints after further questioning- see reports the vision is not equal on both sides and decreased on the right. She reports it feels as if something is stuck in it. She states these symptoms started before all over the above.   Patient's sister is at bedside who continues to diagnose patient telling her what she bleeds it is. When asked what patient believes happened today she reports that she feels she had a panic attack but did not have  increased respirations. But states that "I know the dry mouth and tingling in the arm could be TIA or stroke"  her sister the bedside reports that patient appears to have a droop to her face that she did not notice before.    Past Medical History  Diagnosis Date  . Varicella   . Chronic headaches     2-3 times a month, frontal location, pressure like pain.  . Allergy     year round allergy with sinus drainage. Drugs of choice are decongestants  . Eustachian tube dysfunction     right ore than left  . Drug-induced leukopenia (HCC)     antibiotic induced - 14 day hospitalization 1990's  . Climacteric   . Myalgia    Past Surgical History  Procedure Laterality Date  . Anterior cruciate ligament repair      right knee, secondary trauma w/ ACL repair at 55 y/o  . Fracture pelvis      age 28  . Ovarian cystectomy      left, laproscopically  . Gopo    . Closed reduction clavicle fracture    . Leep  10/02/2012    Procedure: LOOP ELECTROSURGICAL EXCISION PROCEDURE (LEEP);  Surgeon: Darlyn Chamber, MD;  Location: [facility];  Service: Gynecology;  Laterality: N/A;   Family History  Problem Relation Age of Onset  . Hypertension Mother   . Hyperlipidemia Mother   . Arthritis Mother     ankle and hip  . Obesity Mother   .  Heart disease Father     MI/CAD  . COPD Father     lung scarring/COPD  . Hypertension Father   . Hyperlipidemia Father   . Diabetes Father   . Heart disease Brother     MI/PCA-stent  . Hyperlipidemia Brother   . Hypertension Brother   . Obesity Brother   . Cancer Neg Hx    Social History  Substance Use Topics  . Smoking status: Never Smoker   . Smokeless tobacco: Never Used  . Alcohol Use: 1.0 oz/week    2 drink(s) per week   OB History    No data available     Review of Systems  All other systems reviewed and are negative.   Allergies  Codeine and Penicillins  Home Medications   Prior to Admission medications   Medication Sig Start Date End  Date Taking? Authorizing Provider  ALPRAZolam Duanne Moron) 1 MG tablet Take 1 mg by mouth daily. 10/06/15  Yes Historical Provider, MD  ibuprofen (ADVIL,MOTRIN) 200 MG tablet Take 800 mg by mouth every 8 (eight) hours as needed. For back pain.   Yes Historical Provider, MD  levocetirizine (XYZAL) 5 MG tablet Take 5 mg by mouth daily as needed for allergies.    Yes Historical Provider, MD  naproxen sodium (ANAPROX) 220 MG tablet Take 440 mg by mouth once as needed (Pain). Reported on 10/26/2015   Yes Historical Provider, MD  triamterene-hydrochlorothiazide (MAXZIDE-25) 37.5-25 MG per tablet Take 1 tablet by mouth as needed. For diuresis/ edema.   Yes Historical Provider, MD  zolpidem (AMBIEN CR) 12.5 MG CR tablet Take 12.5 mg by mouth at bedtime as needed for sleep.    Yes Historical Provider, MD  amoxicillin-clavulanate (AUGMENTIN) 875-125 MG tablet Take 1 tablet by mouth 2 (two) times daily. 10/26/15   Parilee Hally, PA-C   BP 131/87 mmHg  Pulse 68  Temp(Src) 98.6 F (37 C) (Oral)  Resp 19  SpO2 100%   Physical Exam  Constitutional: She is oriented to person, place, and time. She appears well-developed and well-nourished.  HENT:  Head: Normocephalic and atraumatic.  Right Ear: Hearing, tympanic membrane, external ear and ear canal normal.  Left Ear: Hearing, tympanic membrane, external ear and ear canal normal.  Eyes: Conjunctivae are normal. Pupils are equal, round, and reactive to light. Right eye exhibits no discharge. Left eye exhibits no discharge. No scleral icterus.  Fluorescein stain of right eye shows no foreign body, abrasion, or ulceration no abnormalities noted  Neck: Normal range of motion. No JVD present. No tracheal deviation present.  Cardiovascular: Normal rate, regular rhythm, normal heart sounds and intact distal pulses.  Exam reveals no gallop and no friction rub.   No murmur heard. Pulmonary/Chest: Effort normal and breath sounds normal. No stridor. No respiratory distress.  She has no wheezes. She has no rales. She exhibits no tenderness.  Abdominal: Bowel sounds are normal. She exhibits no mass. There is no tenderness. There is no rebound.  Musculoskeletal: Normal range of motion. She exhibits no edema or tenderness.  Neurological: She is alert and oriented to person, place, and time. She has normal strength. No cranial nerve deficit or sensory deficit. Coordination and gait normal. GCS eye subscore is 4. GCS verbal subscore is 5. GCS motor subscore is 6.  Reflex Scores:      Patellar reflexes are 2+ on the right side and 2+ on the left side. Skin: Skin is warm and dry. No rash noted. No erythema. No pallor.  Psychiatric:  She has a normal mood and affect. Her behavior is normal. Judgment and thought content normal.  Nursing note and vitals reviewed.   ED Course  Procedures (including critical care time) Labs Review Labs Reviewed  COMPREHENSIVE METABOLIC PANEL - Abnormal; Notable for the following:    Glucose, Bld 118 (*)    BUN 23 (*)    ALT 12 (*)    All other components within normal limits  URINALYSIS, ROUTINE W REFLEX MICROSCOPIC (NOT AT [facility]) - Abnormal; Notable for the following:    APPearance CLOUDY (*)    Hgb urine dipstick TRACE (*)    Leukocytes, UA SMALL (*)    All other components within normal limits  URINE MICROSCOPIC-ADD ON - Abnormal; Notable for the following:    Squamous Epithelial / LPF 0-5 (*)    Bacteria, UA FEW (*)    All other components within normal limits  LIPASE, BLOOD  CBC  I-STAT TROPOININ, ED    Imaging Review Dg Chest 2 View  10/26/2015  CLINICAL DATA:  Cough. EXAM: CHEST  2 VIEW COMPARISON:  None. FINDINGS: The heart size and mediastinal contours are within normal limits. Both lungs are clear. The visualized skeletal structures are unremarkable. IMPRESSION: No active cardiopulmonary disease. Electronically Signed   By: Fidela Salisbury M.D.   On: 10/26/2015 18:05   Mr Brain Wo Contrast  10/26/2015  CLINICAL  DATA:  Dizziness and pressure behind the right eye. Right-sided facial tingling. EXAM: MRI HEAD WITHOUT CONTRAST TECHNIQUE: Multiplanar, multiecho pulse sequences of the brain and surrounding structures were obtained without intravenous contrast. COMPARISON:  None. FINDINGS: There is no evidence of acute infarct, intracranial hemorrhage, mass, midline shift, or extra-axial fluid collection. Ventricles and sulci are normal. Small foci of T2 hyperintensity scattered throughout the subcortical and periventricular cerebral white matter bilaterally are minimally advanced for age. Orbits are unremarkable. Paranasal sinuses and mastoid air cells are clear. Major intracranial vascular flow voids are preserved. IMPRESSION: 1. No acute intracranial abnormality. 2. Mild cerebral white matter disease, nonspecific. Considerations include chronic small vessel ischemia, migraines, sequelae of trauma, hypercoagulable state, vasculitis, prior infection or demyelination. Electronically Signed   By: Logan Bores M.D.   On: 10/26/2015 20:53   I have personally reviewed and evaluated these images and lab results as part of my medical decision-making.   EKG Interpretation None      MDM   Final diagnoses:  Dizziness  Tingling  Acute frontal sinusitis, recurrence not specified    Labs: Urinalysis, lipase, CMP, CBC  Imaging: DG chest 2 view, EKG  Consults:  Therapeutics:  Discharge Meds: Augmentin  Assessment/Plan: Patient's presentation is most consistent with anxiety. Patient had numerous complaints, it was difficult to sort through all of her various complaints, that was frequently distracted by her sister who is in the room trying to interpret all of complaints. Due to patient's complaints of pressure behind her eye, dizziness, and tingling in the left hand, MRI was ordered. Patient had specific concerns for stroke, although she had no significant findings. MRI showed no acute findings, all findings from the  exam were read to the patient including differential. Patient had no significant findings on her laboratory analysis, or diagnostic imaging. After her sister left the room and all results were read to patient, she appeared to be more at ease, had no chest pain, shortness of breath, dizziness, or tingling in her fingers. Patient reports this feels like anxiety related as she has significant stress in her life. I have very  low suspicion for any acute organic cause of her complaints. Patient will be discharged home with primary care follow-up. Patient has complaints of congestion in her ears and nose, that has been going on for several weeks. She will be given a prescription for a Augmentin and primary care follow-up. She was given strict concussions, verbalized understanding and agreement for today's plan.         Okey Regal, PA-C 10/27/15 0133  Okey Regal, PA-C 10/27/15 [id number]  Leo Grosser, MD 10/28/15 [phone]

## 2015-10-26 NOTE — ED Notes (Addendum)
Pt returned from MRI °

## 2015-10-26 NOTE — ED Notes (Signed)
Pt's sister walked to registration window and expressed concerns of possible TIA to registration. Pt's sister was a paramedic. Pt brought back to triage and vitals retaken. BP 164/96. HR 88. 100% on RA. Per family appears to have droop on R side of face. Upon assessment by this RN no droop noted. This RN did NIH stroke scale on pt and it was negative.  Family is very concerned this is TIA, though. Pt has hx of anxiety. Family seems to be making pt. Very anxious. Pt states that she now has pressure behind her right eye.

## 2015-10-26 NOTE — ED Notes (Signed)
Pt transported to MRI 

## 2015-10-28 ENCOUNTER — Emergency Department (HOSPITAL_COMMUNITY)
Admission: EM | Admit: 2015-10-28 | Discharge: 2015-10-28 | Disposition: A | Discharge status: Home / Self Care | Payer: Managed Care, Other (non HMO) | Attending: Emergency Medicine | Admitting: Emergency Medicine

## 2015-10-28 ENCOUNTER — Emergency Department (HOSPITAL_COMMUNITY): Payer: Managed Care, Other (non HMO)

## 2015-10-28 ENCOUNTER — Encounter (HOSPITAL_COMMUNITY): Payer: Self-pay | Admitting: Emergency Medicine

## 2015-10-28 DIAGNOSIS — R42 Dizziness and giddiness: Secondary | ICD-10-CM | POA: Diagnosis not present

## 2015-10-28 DIAGNOSIS — J3489 Other specified disorders of nose and nasal sinuses: Secondary | ICD-10-CM | POA: Insufficient documentation

## 2015-10-28 DIAGNOSIS — Z79899 Other long term (current) drug therapy: Secondary | ICD-10-CM | POA: Diagnosis not present

## 2015-10-28 DIAGNOSIS — Z880 Allergy status to penicillin: Secondary | ICD-10-CM | POA: Diagnosis not present

## 2015-10-28 DIAGNOSIS — R51 Headache: Secondary | ICD-10-CM | POA: Diagnosis not present

## 2015-10-28 DIAGNOSIS — Z8669 Personal history of other diseases of the nervous system and sense organs: Secondary | ICD-10-CM | POA: Insufficient documentation

## 2015-10-28 DIAGNOSIS — Z792 Long term (current) use of antibiotics: Secondary | ICD-10-CM | POA: Diagnosis not present

## 2015-10-28 DIAGNOSIS — M79605 Pain in left leg: Secondary | ICD-10-CM | POA: Insufficient documentation

## 2015-10-28 DIAGNOSIS — R519 Headache, unspecified: Secondary | ICD-10-CM

## 2015-10-28 DIAGNOSIS — Z8742 Personal history of other diseases of the female genital tract: Secondary | ICD-10-CM | POA: Diagnosis not present

## 2015-10-28 DIAGNOSIS — Z8619 Personal history of other infectious and parasitic diseases: Secondary | ICD-10-CM | POA: Diagnosis not present

## 2015-10-28 DIAGNOSIS — F410 Panic disorder [episodic paroxysmal anxiety] without agoraphobia: Secondary | ICD-10-CM | POA: Insufficient documentation

## 2015-10-28 DIAGNOSIS — Z862 Personal history of diseases of the blood and blood-forming organs and certain disorders involving the immune mechanism: Secondary | ICD-10-CM | POA: Diagnosis not present

## 2015-10-28 DIAGNOSIS — R29810 Facial weakness: Secondary | ICD-10-CM | POA: Diagnosis present

## 2015-10-28 LAB — COMPREHENSIVE METABOLIC PANEL
ALBUMIN: 4.4 g/dL (ref 3.5–5.0)
ALT: 16 U/L (ref 14–54)
ANION GAP: 13 (ref 5–15)
AST: 19 U/L (ref 15–41)
Alkaline Phosphatase: 76 U/L (ref 38–126)
BILIRUBIN TOTAL: 0.8 mg/dL (ref 0.3–1.2)
BUN: 17 mg/dL (ref 6–20)
CHLORIDE: 105 mmol/L (ref 101–111)
CO2: 19 mmol/L — ABNORMAL LOW (ref 22–32)
Calcium: 9.8 mg/dL (ref 8.9–10.3)
Creatinine, Ser: 0.85 mg/dL (ref 0.44–1.00)
GFR calc Af Amer: >60 mL/min (ref >60)
Glucose, Bld: 98 mg/dL (ref 65–99)
POTASSIUM: 3.8 mmol/L (ref 3.5–5.1)
Sodium: 137 mmol/L (ref 135–145)
TOTAL PROTEIN: 7.1 g/dL (ref 6.5–8.1)

## 2015-10-28 LAB — I-STAT CHEM 8, ED
BUN: 19 mg/dL (ref 6–20)
CALCIUM ION: 1.26 mmol/L — AB (ref 1.12–1.23)
Chloride: 104 mmol/L (ref 101–111)
Creatinine, Ser: 0.7 mg/dL (ref 0.44–1.00)
Glucose, Bld: 99 mg/dL (ref 65–99)
HEMATOCRIT: 42 % (ref 36.0–46.0)
HEMOGLOBIN: 14.3 g/dL (ref 12.0–15.0)
Potassium: 3.9 mmol/L (ref 3.5–5.1)
SODIUM: 136 mmol/L (ref 135–145)
TCO2: 20 mmol/L (ref 0–100)

## 2015-10-28 LAB — CBC
HCT: 38 % (ref 36.0–46.0)
Hemoglobin: 12.6 g/dL (ref 12.0–15.0)
MCH: 29.2 pg (ref 26.0–34.0)
MCHC: 33.2 g/dL (ref 30.0–36.0)
MCV: 88 fL (ref 78.0–100.0)
Platelets: 411 10*3/uL — ABNORMAL HIGH (ref 150–400)
RBC: 4.32 MIL/uL (ref 3.87–5.11)
RDW: 13.5 % (ref 11.5–15.5)
WBC: 13.4 10*3/uL — ABNORMAL HIGH (ref 4.0–10.5)

## 2015-10-28 LAB — CBG MONITORING, ED: GLUCOSE-CAPILLARY: 99 mg/dL (ref 65–99)

## 2015-10-28 LAB — DIFFERENTIAL
BASOS ABS: 0 10*3/uL (ref 0.0–0.1)
BASOS PCT: 0 %
EOS ABS: 0.2 10*3/uL (ref 0.0–0.7)
EOS PCT: 2 %
LYMPHS ABS: 3.6 10*3/uL (ref 0.7–4.0)
Lymphocytes Relative: 27 %
MONOS PCT: 7 %
Monocytes Absolute: 0.9 10*3/uL (ref 0.1–1.0)
NEUTROS PCT: 64 %
Neutro Abs: 8.7 10*3/uL — ABNORMAL HIGH (ref 1.7–7.7)

## 2015-10-28 LAB — PROTIME-INR
INR: 1.09 (ref 0.00–1.49)
Prothrombin Time: 14.3 seconds (ref 11.6–15.2)

## 2015-10-28 LAB — APTT: APTT: 29 s (ref 24–37)

## 2015-10-28 LAB — I-STAT TROPONIN, ED: TROPONIN I, POC: 0.01 ng/mL (ref 0.00–0.08)

## 2015-10-28 MED ORDER — MECLIZINE HCL 25 MG PO TABS: 25.0000 mg | ORAL_TABLET | Freq: Three times a day (TID) | ORAL | Status: DC | PRN

## 2015-10-28 MED ORDER — METHOCARBAMOL 500 MG PO TABS: 500.0000 mg | ORAL_TABLET | Freq: Three times a day (TID) | ORAL | Status: DC | PRN

## 2015-10-28 NOTE — Discharge Instructions (Signed)
Your right sided facial pain, dizziness, and ringing in your ear will need to be further evaluated by an ENT specialist.  Please call to set up an appointment.  Your brain MRI did not show an acute stroke but there are some non specific abnormal finding that you will need to follow up with [facility] next week for further evaluation.  Also use resources below to find a primary care provider for management of your health.  Take robaxin as needed for muscle spasm.   Emergency Department Resource Guide 1) Find a Doctor and Pay Out of Pocket Although you won't have to find out who is covered by your insurance plan, it is a good idea to ask around and get recommendations. You will then need to call the office and see if the doctor you have chosen will accept you as a new patient and what types of options they offer for patients who are self-pay. Some doctors offer discounts or will set up payment plans for their patients who do not have insurance, but you will need to ask so you aren't surprised when you get to your appointment.  2) Contact Your Local Health Department Not all health departments have doctors that can see patients for sick visits, but many do, so it is worth a call to see if yours does. If you don't know where your local health department is, you can check in your phone book. The CDC also has a tool to help you locate your state's health department, and many state websites also have listings of all of their local health departments.  3) Find a [facility] If your illness is not likely to be very severe or complicated, you may want to try a walk in clinic. These are popping up all over the country in pharmacies, drugstores, and shopping centers. They're usually staffed by nurse practitioners or physician assistants that have been trained to treat common illnesses and complaints. They're usually fairly quick and inexpensive. However, if you have serious medical issues or chronic  medical problems, these are probably not your best option.  No Primary Care Doctor: - Call Health Connect at  [phone] - they can help you locate a primary care doctor that  accepts your insurance, provides certain services, etc. - Physician Referral Service- [phone]  Chronic Pain Problems: Organization         Address  Phone   Notes  [facility]  [phone] Patients need to be referred by their primary care doctor.   Medication Assistance: Organization         Address  Phone   Notes  [facility] Medication [facility] [address] [place], [place] [address] [phone] --Must be a resident of [facility] -- Must have NO insurance coverage whatsoever (no Medicaid/ Medicare, etc.) -- The pt. MUST have a primary care doctor that directs their care regularly and follows them in the community   [facility]  [phone]   [employer]  [phone]    Agencies that provide inexpensive medical care: Organization         Address  Phone   Notes  [place]  [phone]   [facility]    [phone]   [facility] [place], [place] [address] [phone]   [facility] [address], [place] [phone]   Planned Parenthood    [phone]  [facility]    [phone]   [facility]  [address], [place] Phone:  [phone], Fax:  [phone] Hours of Operation:  9 am - 6 pm, M-F.  Also accepts Medicaid/Medicare and self-pay.  [facility] [place] [address], [address], [place] Phone: [phone], Fax: [phone]. Hours of Operation:  8:30 am - 5:30 pm, M-F.  Also accepts Medicaid and self-pay.  [facility] [place] [address], [place] Phone: [phone]   [place], [place], [place] [phone],  Ext. [phone] Mondays & Thursdays: 7-9 AM.  First 15 patients are seen on a first come, first serve basis.    [place] Providers:  Organization         Address  Phone   Notes  [facility] [address], [place] [phone] Also accepts self-pay patients.  [facility] [address], [place]  [phone]   [place], [address], [place] [phone]   [facility] [address] [place] [phone]   Lucianne Lei [address], [place]   [phone] Only accepts [place] Access [place] patients after they have their name applied to their card.   Self-Pay (no insurance) in [facility]:  Organization         Address  Phone   Notes  Sickle Cell Patients, [facility] [place] [phone]   [facility] [place] [phone]   [facility] [place]  [place], [place], [place] [phone]   [facility]/Dr. Osei-Bonsu  [address], [place] [phone] Phone number for both [place] and [place] locations is the same.  [facility] [address], [place] [phone]   [facility] [address] [place] or [address] [phone]   [facility] [address] [place] [phone], phone; [phone], fax Sees patients 1st and 3rd Saturday of every month.  Must not qualify for public or private insurance (i.e. Medicaid, Medicare, [place] Health Choice, Veterans' Benefits)  Household income should be no more than 200% of the poverty level The clinic cannot treat you if you are pregnant or think you are pregnant  Sexually transmitted diseases are not  treated at the clinic.    Dental Care: Organization         Address  Phone  Notes  [facility] Department of [facility] [phone] Accepts children up to age 45 who are enrolled in [place] or [place]; pregnant women with a Medicaid card; and children who have applied for Medicaid or [place] Health Choice, but were declined, whose parents can pay a reduced fee at time of service.  [facility] of [facility]  [address], [place] [phone] Accepts children up to age 59 who are enrolled in [place] or [place]; pregnant women with a Medicaid card; and children who have applied for Medicaid or [place] Health Choice, but were declined, whose parents can pay a reduced fee at time  of service.  °[facility] ° [address], [place] [phone] Patients are seen by appointment only. Walk-ins are not accepted. [facility] will see patients 18 years of age and older. °Monday - Tuesday (8am-5pm) °Most Wednesdays (8:30-5pm) °$30 per visit, cash only  °[facility] ° [address], [place] [phone] Patients are seen by appointment only. Walk-ins are not accepted. [facility] will see patients 18 years of age and older. °One Wednesday Evening (Monthly: Volunteer Based).  $30 per visit, cash only  °[facility]  [phone] for adults; Children under age 4, call Graduate Pediatric Dentistry at [phone]. Children aged 4-14, please call [phone] to request a pediatric application. ° Dental services are provided in all areas of dental care including fillings, crowns and bridges, complete and partial dentures, implants, gum treatment, root canals, and extractions. Preventive care is also provided. Treatment is provided to both adults and children. °Patients are selected via a lottery and there is  often a waiting list. °  °[facility] [address], °[place] ° ([phone] [email or web address].[email or web address] °  °[facility] [address], [place], [place] [phone], Ext. [phone] Second and Fourth Thursday of each month, opens at 6:30 AM; Clinic ends at 9 AM.  Patients are seen on a first-come first-served basis, and a limited number are seen during each clinic.  ° °[facility] ° [address], [place], [place] [phone]   Eligibility Requirements °You must have lived in [place], [place], or [place] counties for at least the last three months. °  You cannot be eligible for state or federal sponsored healthcare insurance, including Veterans Administration, Medicaid, or Medicare. °  You generally cannot be eligible for healthcare insurance through your employer.  °  How to apply: °Eligibility screenings are held every Tuesday and Wednesday afternoon from 1:00 pm until 4:00 pm. You do not need an appointment for the interview!  °[facility] [address], [place], [place] [phone]   °[facility]  [phone]   °[facility]  [phone]   °[facility]  336-570-6415   ° °Behavioral Health Resources in the Community: °Intensive Outpatient Programs °Organization         Address  Phone  Notes  °[facility] [address], [place], [place] [phone]   °[facility] [address], [place], [place] [phone]   °[facility] [address], [place], [place] ° [phone]   °[facility] [address],  °[place], [place] 1-800-853-5163 or 336-641-4981   °Substance Abuse Resources °Organization         Address  Phone  Notes  °[facility]  336-882-2125   °[facility]  [phone]   °[facility]  [phone]   °[facility]  [phone]   °Residential & Outpatient Substance  Abuse Program  1-800-659-3381   °Psychological Services °Organization         Address  Phone  Notes  °[facility]  336- [phone]   °[facility]  336- [phone]   °[facility] [address], [place] 1-800-853-5163 or 336-641-4981   ° °Mobile Crisis Teams °Organization         Address  Phone  Notes  °[facility], Mobile Crisis Care Unit  1-877-626-1772   °Assertive °Psychotherapeutic   Services  [address] [place], [place]   [facility] [address], [place] [phone]    Self-Help/Support Groups Organization         Address  Phone             Notes  [place]. of [place] - variety of support groups  [place] Call for more information  Narcotics Anonymous (NA), [facility] [address], [employer] [place]  2 meetings at this location   Special educational needs teacher         Address  Phone  Notes  [facility] [place],    [place]  [phone]   [facility]  [address] [place] [address], [place], [place]   [place], [place] [phone] Admissions: 8am-3pm M-F  Incentives Substance [place] [address]    [place], [place] [phone]   [facility] [address] [place], [place], [place]   [facility] [address],  [place], [place]   [facility] - Intensive Outpatient [address], [place], [place]   [facility] ([place] [address].,  [place], [place] 1-(302)[phone] or [phone]   [facility] ([facility]) [address] [place], [place] Accepts Medicaid  [facility] [place] [address],  [place] 1-7401063853 Substance Abuse/Addiction Treatment   [facility] Organization          Address  Phone  Notes  [facility]  6098293892   Domenic Schwab, PhD [address] [place], [place]   [phone] or [phone]   [place], [place] [phone]   [facility] [address], [place], [place] [phone] Insurance/Medicaid/sponsorship through [facility] [address] [place]                                    [place], [place] [phone] [place] [address], [place] [phone]    Dr. Adele Schilder  [phone]   [facility] Dept. 1) [address] [place] 2) [place] 3)  [place] [address], [place] [phone]  [phone]   [place] [phone] or [phone] (After Hours)

## 2015-10-28 NOTE — ED Notes (Signed)
Pt from home for eval of recurrent stroke symptoms, pt states was seen at [facility] for same and had neuro work up and was told she was having panic attacks. Pt states at 0500 this morning she started to notice right sided facial droop, eye watering, and trouble moving her right foot. Pt denies any neuro symptoms at this time. No neuro deficits noted at this time.

## 2015-10-28 NOTE — ED Provider Notes (Signed)
CSN: [id number]     Arrival date & time 10/28/15  1431 History   First MD Initiated Contact with Patient 10/28/15 1658     Chief Complaint  Patient presents with  . Stroke Symptoms     (Consider location/radiation/quality/duration/timing/severity/associated sxs/prior Treatment) HPI   55 year old female with history of anxiety, chronic headache, present to the ED from home for evaluation of strokelike symptoms. Patient states this morning she noticed facial droops to both side of her mouth, L eye watering, and trouble moving her left foot. Symptom has since resolved. She was seen at [facility] for similar complaint 2 days ago including bilateral lower mouth droops. She has had a head CT scan and a brain MRI that shows no acute finding. She was diagnosed with having a panic attack. She also had sinus complaints and was prescribed Augmentin as treatment.  Today, Pt c/o pressure, fullness with vision changes to R side of face.  Had a cold 2 weeks ago treated with abx.  Yesterday she experienced numbness around her mouth lasting for a couple hrs and resolved.  Report having fullness to her sinus.  Report having new vision changes including decreased vision in R eye as compared to left.  Report having fever over the weekend, Tmax of 102 but has since resolved.  Pt has been taking various cold/flu medication without relief.  Report having dizziness, lightheadedness.  Report long standing sciata to L leg which is currently not causing any sxs.  At this time she report most of her sxs has since resolved.  Does admits to having a new job and states it has been stressful.       Past Medical History  Diagnosis Date  . Varicella   . Chronic headaches     2-3 times a month, frontal location, pressure like pain.  . Allergy     year round allergy with sinus drainage. Drugs of choice are decongestants  . Eustachian tube dysfunction     right ore than left  . Drug-induced leukopenia (HCC)     antibiotic  induced - 14 day hospitalization 1990's  . Climacteric   . Myalgia    Past Surgical History  Procedure Laterality Date  . Anterior cruciate ligament repair      right knee, secondary trauma w/ ACL repair at 55 y/o  . Fracture pelvis      age 78  . Ovarian cystectomy      left, laproscopically  . Gopo    . Closed reduction clavicle fracture    . Leep  10/02/2012    Procedure: LOOP ELECTROSURGICAL EXCISION PROCEDURE (LEEP);  Surgeon: Darlyn Chamber, MD;  Location: [facility];  Service: Gynecology;  Laterality: N/A;   Family History  Problem Relation Age of Onset  . Hypertension Mother   . Hyperlipidemia Mother   . Arthritis Mother     ankle and hip  . Obesity Mother   . Heart disease Father     MI/CAD  . COPD Father     lung scarring/COPD  . Hypertension Father   . Hyperlipidemia Father   . Diabetes Father   . Heart disease Brother     MI/PCA-stent  . Hyperlipidemia Brother   . Hypertension Brother   . Obesity Brother   . Cancer Neg Hx    Social History  Substance Use Topics  . Smoking status: Never Smoker   . Smokeless tobacco: Never Used  . Alcohol Use: 1.0 oz/week    2 drink(s) per week  OB History    No data available     Review of Systems  All other systems reviewed and are negative.     Allergies  Codeine and Penicillins  Home Medications   Prior to Admission medications   Medication Sig Start Date End Date Taking? Authorizing Provider  ALPRAZolam Duanne Moron) 1 MG tablet Take 1 mg by mouth daily. 10/06/15   Historical Provider, MD  amoxicillin-clavulanate (AUGMENTIN) 875-125 MG tablet Take 1 tablet by mouth 2 (two) times daily. 10/26/15   Okey Regal, PA-C  ibuprofen (ADVIL,MOTRIN) 200 MG tablet Take 800 mg by mouth every 8 (eight) hours as needed. For back pain.    Historical Provider, MD  levocetirizine (XYZAL) 5 MG tablet Take 5 mg by mouth daily as needed for allergies.     Historical Provider, MD  naproxen sodium (ANAPROX) 220 MG tablet Take 440 mg  by mouth once as needed (Pain). Reported on 10/26/2015    Historical Provider, MD  triamterene-hydrochlorothiazide (MAXZIDE-25) 37.5-25 MG per tablet Take 1 tablet by mouth as needed. For diuresis/ edema.    Historical Provider, MD  zolpidem (AMBIEN CR) 12.5 MG CR tablet Take 12.5 mg by mouth at bedtime as needed for sleep.     Historical Provider, MD   BP 127/85 mmHg  Pulse 95  Temp(Src) 98 F (36.7 C) (Oral)  Resp 18  Ht 5\' 5"  (1.651 m)  Wt 83.915 kg  BMI 30.79 kg/m2  SpO2 99% Physical Exam  Constitutional: She is oriented to person, place, and time. She appears well-developed and well-nourished. No distress.  HENT:  Head: Atraumatic.  Right Ear: External ear normal.  Left Ear: External ear normal.  Mouth/Throat: Oropharynx is clear and moist.  Tenderness to maxillary sinus on percussion.  Eyes: Conjunctivae are normal.  Neck: Normal range of motion. Neck supple.  No nuchal rigidity  Cardiovascular: Normal rate and regular rhythm.   Pulmonary/Chest: Effort normal and breath sounds normal.  Abdominal: Soft. There is no tenderness.  Musculoskeletal: She exhibits no tenderness.  Neurological: She is alert and oriented to person, place, and time.  Neurologic exam:  Speech clear, pupils equal round reactive to light, extraocular movements intact  Normal peripheral visual fields Cranial nerves III through XII normal including no facial droop Follows commands, moves all extremities x4, normal strength to bilateral upper and lower extremities at all major muscle groups including grip Sensation normal to light touch and pinprick Coordination intact, no limb ataxia, finger-nose-finger normal Rapid alternating movements normal No pronator drift Gait normal   Skin: No rash noted.  Psychiatric: She has a normal mood and affect.  Nursing note and vitals reviewed.   ED Course  Procedures (including critical care time) Labs Review Labs Reviewed  CBC - Abnormal; Notable for the  following:    WBC 13.4 (*)    Platelets 411 (*)    All other components within normal limits  DIFFERENTIAL - Abnormal; Notable for the following:    Neutro Abs 8.7 (*)    All other components within normal limits  COMPREHENSIVE METABOLIC PANEL - Abnormal; Notable for the following:    CO2 19 (*)    All other components within normal limits  I-STAT CHEM 8, ED - Abnormal; Notable for the following:    Calcium, Ion 1.26 (*)    All other components within normal limits  PROTIME-INR  APTT  I-STAT TROPOININ, ED  CBG MONITORING, ED    Imaging Review Dg Chest 2 View  10/26/2015  CLINICAL DATA:  Cough. EXAM: CHEST  2 VIEW COMPARISON:  None. FINDINGS: The heart size and mediastinal contours are within normal limits. Both lungs are clear. The visualized skeletal structures are unremarkable. IMPRESSION: No active cardiopulmonary disease. Electronically Signed   By: Fidela Salisbury M.D.   On: 10/26/2015 18:05   Ct Head Wo Contrast  10/28/2015  CLINICAL DATA:  Sensation of pressure behind the right eye and ear, starting 10/26/2015. EXAM: CT HEAD WITHOUT CONTRAST TECHNIQUE: Contiguous axial images were obtained from the base of the skull through the vertex without intravenous contrast. COMPARISON:  10/26/2015 FINDINGS: The brainstem, cerebellum, cerebral peduncles, thalami, basal ganglia, basilar cisterns, and ventricular system appear within normal limits. No intracranial hemorrhage, mass lesion, or acute CVA. Visualized paranasal sinuses appear clear. IMPRESSION: 1.  No significant abnormality identified. Electronically Signed   By: Van Clines M.D.   On: 10/28/2015 15:58   Mr Brain Wo Contrast  10/26/2015  CLINICAL DATA:  Dizziness and pressure behind the right eye. Right-sided facial tingling. EXAM: MRI HEAD WITHOUT CONTRAST TECHNIQUE: Multiplanar, multiecho pulse sequences of the brain and surrounding structures were obtained without intravenous contrast. COMPARISON:  None. FINDINGS:  There is no evidence of acute infarct, intracranial hemorrhage, mass, midline shift, or extra-axial fluid collection. Ventricles and sulci are normal. Small foci of T2 hyperintensity scattered throughout the subcortical and periventricular cerebral white matter bilaterally are minimally advanced for age. Orbits are unremarkable. Paranasal sinuses and mastoid air cells are clear. Major intracranial vascular flow voids are preserved. IMPRESSION: 1. No acute intracranial abnormality. 2. Mild cerebral white matter disease, nonspecific. Considerations include chronic small vessel ischemia, migraines, sequelae of trauma, hypercoagulable state, vasculitis, prior infection or demyelination. Electronically Signed   By: Logan Bores M.D.   On: 10/26/2015 20:53   I have personally reviewed and evaluated these images and lab results as part of my medical decision-making.   EKG Interpretation None      MDM   Final diagnoses:  Pressure and pain of right side of face    BP 122/89 mmHg  Pulse 69  Temp(Src) 98 F (36.7 C) (Oral)  Resp 16  Ht 5\' 5"  (1.651 m)  Wt 83.915 kg  BMI 30.79 kg/m2  SpO2 96%   5:39 PM Patient presents with primary complaint of pressure to the right side of her face, ringing in her right ear, having bouts of dizziness, and having sinus congestion. Symptoms slightly ENT related. She did complaining of bilateral low mouth droops which has since resolved. She has no focal neuro deficit on exam. Symptoms not likely to be a stroke. She was seen in the ED 2 days ago for this similar complaint and had a negative brain MRI. Her head CT scan today is unremarkable. Mild elevated WBC of 13.4 without any left shift. Her ABCD 2 score is 2 which put her at low risk for stroke. She does have history of eustachian tube dysfunction which I think may contribute to her sxs.    6:21 PM Since pt's MRI is not entirely normal, i encourage pt to f/u with neurology for further evaluation including ruling  out for MS.  ENT referral given as well.  She also request for muscle relaxant, will prescribe robaxin. Care discussed with Dr. Tomi Bamberger.    Domenic Moras, PA-C 10/28/15 Vernelle Emerald  Dorie Rank, MD 10/29/15 [phone]

## 2016-03-03 ENCOUNTER — Other Ambulatory Visit: Payer: Self-pay | Admitting: Obstetrics and Gynecology

## 2016-03-03 DIAGNOSIS — N644 Mastodynia: Secondary | ICD-10-CM

## 2016-03-08 ENCOUNTER — Ambulatory Visit
Admission: RE | Admit: 2016-03-08 | Discharge: 2016-03-08 | Disposition: A | Discharge status: Home / Self Care | Payer: Managed Care, Other (non HMO) | Source: Ambulatory Visit | Attending: Obstetrics and Gynecology | Admitting: Obstetrics and Gynecology

## 2016-03-08 DIAGNOSIS — N644 Mastodynia: Secondary | ICD-10-CM

## 2016-04-26 ENCOUNTER — Encounter: Payer: Self-pay | Admitting: Obstetrics and Gynecology

## 2016-05-09 ENCOUNTER — Encounter: Payer: Managed Care, Other (non HMO) | Admitting: Gastroenterology

## 2016-07-25 ENCOUNTER — Encounter: Payer: Self-pay | Admitting: Internal Medicine

## 2016-07-26 ENCOUNTER — Encounter: Payer: Self-pay | Admitting: *Deleted

## 2016-08-03 ENCOUNTER — Encounter: Payer: Self-pay | Admitting: Internal Medicine

## 2016-08-03 ENCOUNTER — Ambulatory Visit (INDEPENDENT_AMBULATORY_CARE_PROVIDER_SITE_OTHER): Payer: Managed Care, Other (non HMO) | Admitting: Internal Medicine

## 2016-08-03 ENCOUNTER — Encounter (INDEPENDENT_AMBULATORY_CARE_PROVIDER_SITE_OTHER): Payer: Self-pay

## 2016-08-03 VITALS — BP 116/80 | HR 84 | Ht 64.0 in | Wt 174.5 lb

## 2016-08-03 DIAGNOSIS — R1013 Epigastric pain: Secondary | ICD-10-CM

## 2016-08-03 DIAGNOSIS — R1310 Dysphagia, unspecified: Secondary | ICD-10-CM

## 2016-08-03 DIAGNOSIS — Z1211 Encounter for screening for malignant neoplasm of colon: Secondary | ICD-10-CM

## 2016-08-03 MED ORDER — NA SULFATE-K SULFATE-MG SULF 17.5-3.13-1.6 GM/177ML PO SOLN: ORAL | 0 refills | Status: DC

## 2016-08-03 NOTE — Progress Notes (Signed)
Patient ID: Diane Frye, female   DOB: 1961-02-04, 55 y.o.   MRN: [id number] HPI: Cha Lockette is a 55 year old female with a past medical history of headaches, back pain with sciatica, and probable sleep apnea (no formal sleep study) who is seen to discuss screening colonoscopy and intermittent dysphagia as well as epigastric pain. She is here alone today.  She states that she has been dealing with lower back pain and left sciatica pain with some radiculopathy type symptoms. She's been working with chiropractor and has evaluation with neurosurgery pending. This led to the use of high-dose ibuprofen over a three-month period. About a month and a half ago she developed about 10 days of epigastric abdominal pain belching and worsening of heartburn. She also had several days of dark stools. This led her to discontinue ibuprofen completely. Her epigastric pain has resolved. She has had intermittent trouble swallowing both solids and liquids. This is sporadic and unpredictable. She denies odynophagia. No nausea or vomiting. Again abdominal pain has resolved. Her bowel movements have been normal recently without any darkness, certainly no blood or melena. No prior colonoscopy. No family history of colon cancer or IBD. Her mother has required esophageal dilatation in the past.  Past Medical History:  Diagnosis Date  . Allergy    year round allergy with sinus drainage. Drugs of choice are decongestants  . Anxiety   . Chronic headaches    2-3 times a month, frontal location, pressure like pain.  . Clavicle fracture   . Climacteric   . Drug-induced leukopenia (HCC)    antibiotic induced - 14 day hospitalization 1990's  . Eustachian tube dysfunction    right ore than left  . Fractured pelvis ([place])   . Myalgia   . Sleep apnea   . Varicella     Past Surgical History:  Procedure Laterality Date  . ANTERIOR CRUCIATE LIGAMENT REPAIR     right knee, secondary trauma w/ ACL repair at 55 y/o  .  GoPo    . LEEP  10/02/2012   Procedure: LOOP ELECTROSURGICAL EXCISION PROCEDURE (LEEP);  Surgeon: Darlyn Chamber, MD;  Location: [facility];  Service: Gynecology;  Laterality: N/A;  . ovarian cystectomy     left, laproscopically    Outpatient Medications Prior to Visit  Medication Sig Dispense Refill  . ALPRAZolam (XANAX) 1 MG tablet Take 1 mg by mouth daily.  0  . dextromethorphan-guaiFENesin (MUCINEX DM) 30-600 MG 12hr tablet Take 1 tablet by mouth 2 (two) times daily as needed for cough.    . levocetirizine (XYZAL) 5 MG tablet Take 5 mg by mouth daily as needed for allergies.     . mometasone (NASONEX) 50 MCG/ACT nasal spray Place 2 sprays into the nose daily as needed. For allergies    . pseudoephedrine (SUDAFED) 30 MG tablet Take 30 mg by mouth every 4 (four) hours as needed for congestion.    . triamterene-hydrochlorothiazide (MAXZIDE-25) 37.5-25 MG per tablet Take 1 tablet by mouth as needed. For diuresis/ edema.    Marland Kitchen zolpidem (AMBIEN CR) 12.5 MG CR tablet Take 12.5 mg by mouth at bedtime as needed for sleep.     Marland Kitchen amoxicillin-clavulanate (AUGMENTIN) 875-125 MG tablet Take 1 tablet by mouth 2 (two) times daily. 14 tablet 0  . ibuprofen (ADVIL,MOTRIN) 200 MG tablet Take 800 mg by mouth every 8 (eight) hours as needed. For back pain.    Marland Kitchen ibuprofen (ADVIL,MOTRIN) 800 MG tablet Take 800 mg by mouth every 6 (six) hours as  needed for moderate pain.    . meclizine (ANTIVERT) 25 MG tablet Take 1 tablet (25 mg total) by mouth 3 (three) times daily as needed for dizziness or nausea. 20 tablet 0  . methocarbamol (ROBAXIN) 500 MG tablet Take 1 tablet (500 mg total) by mouth every 8 (eight) hours as needed for muscle spasms. 20 tablet 0   No facility-administered medications prior to visit.     Allergies  Allergen Reactions  . Codeine Nausea Only    Doesn't tolerate oral dosing.  Marland Kitchen Penicillins Nausea Only    Has patient had a PCN reaction causing immediate rash, facial/tongue/throat swelling, SOB  or lightheadedness with hypotension: No Has patient had a PCN reaction causing severe rash involving mucus membranes or skin necrosis: No Has patient had a PCN reaction that required hospitalization: No Has patient had a PCN reaction occurring within the last 10 years: No If all of the above answers are "NO", then may proceed with Cephalosporin use.     Family History  Problem Relation Age of Onset  . Hypertension Mother   . Hyperlipidemia Mother   . Arthritis Mother     ankle and hip  . Heart disease Father     MI/CAD  . COPD Father     lung scarring/COPD  . Hypertension Father   . Hyperlipidemia Father   . Diabetes Father   . Heart disease Brother     MI/PCA-stent  . Hyperlipidemia Brother   . Hypertension Brother   . Obesity Brother   . Cancer Neg Hx     Social History  Substance Use Topics  . Smoking status: Never Smoker  . Smokeless tobacco: Never Used  . Alcohol use 1.0 oz/week    2 Standard drinks or equivalent per week     Comment: social    ROS: As per history of present illness, otherwise negative  BP 116/80 (BP Location: Left Arm, Patient Position: Sitting, Cuff Size: Normal)   Pulse 84   Ht 5\' 4"  (1.626 m) Comment: height measured without shoes  Wt 174 lb 8 oz (79.2 kg)   BMI 29.95 kg/m  Constitutional: Well-developed and well-nourished. No distress. HEENT: Normocephalic and atraumatic. Oropharynx is clear and moist. No oropharyngeal exudate. Conjunctivae are normal.  No scleral icterus. Neck: Neck supple. Trachea midline. Cardiovascular: Normal rate, regular rhythm and intact distal pulses. No M/R/G Pulmonary/chest: Effort normal and breath sounds normal. No wheezing, rales or rhonchi. Abdominal: Soft, nontender, nondistended. Bowel sounds active throughout. There are no masses palpable. No hepatosplenomegaly. Extremities: no clubbing, cyanosis, or edema Lymphadenopathy: No cervical adenopathy noted. Neurological: Alert and oriented to person place  and time. Skin: Skin is warm and dry. No rashes noted. Psychiatric: Normal mood and affect. Behavior is normal.  RELEVANT LABS AND IMAGING: CBC    Component Value Date/Time   WBC 13.4 (H) 10/28/2015 1437   RBC 4.32 10/28/2015 1437   HGB 14.3 10/28/2015 1448   HCT 42.0 10/28/2015 1448   PLT 411 (H) 10/28/2015 1437   MCV 88.0 10/28/2015 1437   MCH 29.2 10/28/2015 1437   MCHC 33.2 10/28/2015 1437   RDW 13.5 10/28/2015 1437   LYMPHSABS 3.6 10/28/2015 1437   MONOABS 0.9 10/28/2015 1437   EOSABS 0.2 10/28/2015 1437   BASOSABS 0.0 10/28/2015 1437    CMP     Component Value Date/Time   NA 136 10/28/2015 1448   K 3.9 10/28/2015 1448   CL 104 10/28/2015 1448   CO2 19 (L) 10/28/2015 1437  GLUCOSE 99 10/28/2015 1448   BUN 19 10/28/2015 1448   CREATININE 0.70 10/28/2015 1448   CALCIUM 9.8 10/28/2015 1437   PROT 7.1 10/28/2015 1437   ALBUMIN 4.4 10/28/2015 1437   AST 19 10/28/2015 1437   ALT 16 10/28/2015 1437   ALKPHOS 76 10/28/2015 1437   BILITOT 0.8 10/28/2015 1437   GFRNONAA >60 10/28/2015 1437   GFRAA >60 10/28/2015 1437    ASSESSMENT/PLAN: 55 year old female with a past medical history of headaches, back pain with sciatica, and probable sleep apnea (no formal sleep study) who is seen to discuss screening colonoscopy and intermittent dysphagia as well as epigastric pain.  1. Intermittent dysphagia/NSAID induced gastritis -- her dysphagia is intermittent to both solids and liquids. I have recommended upper endoscopy with possible dilation. We discussed the risks, benefits and alternatives and she wishes to proceed. Her epigastric pain, which is now resolved, was likely related to NSAID induced gastritis and possibly ulcer disease. We will also evaluate for gastroduodenitis at the time of her upper endoscopy. She has stopped NSAIDs and thus I'm not starting a PPI for now.  2. CRC screening -- average risk. Colonoscopy recommended for screening. We discussed the risks, benefits  and alternatives and she wishes to proceed.      Cc:No referring provider defined for this encounter.

## 2016-08-03 NOTE — Patient Instructions (Signed)
You have been scheduled for an endoscopy and colonoscopy. Please follow the written instructions given to you at your visit today. Please pick up your prep supplies at the pharmacy within the next 1-3 days. If you use inhalers (even only as needed), please bring them with you on the day of your procedure. Your physician has requested that you go to www.startemmi.com and enter the access code given to you at your visit today. This web site gives a general overview about your procedure. However, you should still follow specific instructions given to you by our office regarding your preparation for the procedure.  If you are age 30 or older, your body mass index should be between 23-30. Your Body mass index is 29.95 kg/m. If this is out of the aforementioned range listed, please consider follow up with your Primary Care Provider.  If you are age 17 or younger, your body mass index should be between 19-25. Your Body mass index is 29.95 kg/m. If this is out of the aformentioned range listed, please consider follow up with your Primary Care Provider.

## 2016-09-07 ENCOUNTER — Ambulatory Visit (AMBULATORY_SURGERY_CENTER): Payer: Managed Care, Other (non HMO) | Admitting: Internal Medicine

## 2016-09-07 ENCOUNTER — Encounter: Payer: Self-pay | Admitting: Internal Medicine

## 2016-09-07 VITALS — BP 131/70 | HR 64 | Temp 97.3°F | Resp 18 | Ht 64.0 in | Wt 174.0 lb

## 2016-09-07 DIAGNOSIS — Z1211 Encounter for screening for malignant neoplasm of colon: Secondary | ICD-10-CM

## 2016-09-07 DIAGNOSIS — Z1212 Encounter for screening for malignant neoplasm of rectum: Secondary | ICD-10-CM | POA: Diagnosis not present

## 2016-09-07 DIAGNOSIS — K2990 Gastroduodenitis, unspecified, without bleeding: Secondary | ICD-10-CM

## 2016-09-07 DIAGNOSIS — K2970 Gastritis, unspecified, without bleeding: Secondary | ICD-10-CM

## 2016-09-07 DIAGNOSIS — D120 Benign neoplasm of cecum: Secondary | ICD-10-CM | POA: Diagnosis not present

## 2016-09-07 DIAGNOSIS — R1310 Dysphagia, unspecified: Secondary | ICD-10-CM | POA: Diagnosis not present

## 2016-09-07 DIAGNOSIS — D124 Benign neoplasm of descending colon: Secondary | ICD-10-CM

## 2016-09-07 MED ORDER — PANTOPRAZOLE SODIUM 40 MG PO TBEC: 40.0000 mg | DELAYED_RELEASE_TABLET | Freq: Every day | ORAL | 5 refills | Status: AC

## 2016-09-07 MED ORDER — SODIUM CHLORIDE 0.9 % IV SOLN: 500.0000 mL | INTRAVENOUS | Status: AC

## 2016-09-07 NOTE — Progress Notes (Signed)
Called to room to assist during endoscopic procedure.  Patient ID and intended procedure confirmed with present staff. Received instructions for my participation in the procedure from the performing physician.  

## 2016-09-07 NOTE — Op Note (Signed)
[facility] Patient Name: Diane Frye Procedure Date: 09/07/2016 3:01 PM MRN: [id number] Endoscopist: Jerene Bears , MD Age: 55 Referring MD:  Date of Birth: 05/15/1961 Gender: Female Account #: [id number] Procedure:                Colonoscopy Indications:              Screening for colorectal malignant neoplasm, This                            is the patient's first colonoscopy Medicines:                Monitored Anesthesia Care Procedure:                Pre-Anesthesia Assessment:                           - Prior to the procedure, a History and Physical                            was performed, and patient medications and                            allergies were reviewed. The patient's tolerance of                            previous anesthesia was also reviewed. The risks                            and benefits of the procedure and the sedation                            options and risks were discussed with the patient.                            All questions were answered, and informed consent                            was obtained. Prior Anticoagulants: The patient has                            taken no previous anticoagulant or antiplatelet                            agents. ASA Grade Assessment: II - A patient with                            mild systemic disease. After reviewing the risks                            and benefits, the patient was deemed in                            satisfactory condition to undergo the procedure.  After obtaining informed consent, the colonoscope                            was passed under direct vision. Throughout the                            procedure, the patient's blood pressure, pulse, and                            oxygen saturations were monitored continuously. The                            Model PCF-H190L [id number]) scope was introduced                            through the anus and  advanced to the the cecum,                            identified by appendiceal orifice and ileocecal                            valve. The colonoscopy was performed without                            difficulty. The patient tolerated the procedure                            well. The quality of the bowel preparation was                            good. The ileocecal valve, appendiceal orifice, and                            rectum were photographed. Scope In: 3:32:08 PM Scope Out: 3:45:18 PM Scope Withdrawal Time: 0 hours 10 minutes 27 seconds  Total Procedure Duration: 0 hours 13 minutes 10 seconds  Findings:                 The digital rectal exam was normal.                           A 5 mm polyp was found in the cecum. The polyp was                            sessile. The polyp was removed with a cold snare.                            Resection and retrieval were complete.                           Two sessile polyps were found in the descending                            colon. The polyps were 3 to 4  mm in size. These                            polyps were removed with a cold snare. Resection                            and retrieval were complete.                           The exam was otherwise without abnormality on                            direct and retroflexion views. Complications:            No immediate complications. Estimated Blood Loss:     Estimated blood loss: none. Impression:               - One 5 mm polyp in the cecum, removed with a cold                            snare. Resected and retrieved.                           - Two 3 to 4 mm polyps in the descending colon,                            removed with a cold snare. Resected and retrieved.                           - The examination was otherwise normal on direct                            and retroflexion views. Recommendation:           - Patient has a contact number available for                             emergencies. The signs and symptoms of potential                            delayed complications were discussed with the                            patient. Return to normal activities tomorrow.                            Written discharge instructions were provided to the                            patient.                           - Resume previous diet.                           - Continue present medications.                           -  Await pathology results.                           - Repeat colonoscopy is recommended. The                            colonoscopy date will be determined after pathology                            results from today's exam become available for                            review. Jerene Bears, MD 09/07/2016 3:55:16 PM This report has been signed electronically.

## 2016-09-07 NOTE — Progress Notes (Signed)
Report to PACU, RN, vss, BBS= Clear.  

## 2016-09-07 NOTE — Patient Instructions (Signed)
YOU HAD AN ENDOSCOPIC PROCEDURE TODAY AT [facility]:   Refer to the procedure report that was given to you for any specific questions about what was found during the examination.  If the procedure report does not answer your questions, please call your gastroenterologist to clarify.  If you requested that your care partner not be given the details of your procedure findings, then the procedure report has been included in a sealed envelope for you to review at your convenience later.  YOU SHOULD EXPECT: Some feelings of bloating in the abdomen. Passage of more gas than usual.  Walking can help get rid of the air that was put into your GI tract during the procedure and reduce the bloating. If you had a lower endoscopy (such as a colonoscopy or flexible sigmoidoscopy) you may notice spotting of blood in your stool or on the toilet paper. If you underwent a bowel prep for your procedure, you may not have a normal bowel movement for a few days.  Please Note:  You might notice some irritation and congestion in your nose or some drainage.  This is from the oxygen used during your procedure.  There is no need for concern and it should clear up in a day or so.  SYMPTOMS TO REPORT IMMEDIATELY:   Following lower endoscopy (colonoscopy or flexible sigmoidoscopy):  Excessive amounts of blood in the stool  Significant tenderness or worsening of abdominal pains  Swelling of the abdomen that is new, acute  Fever of 100F or higher   Following upper endoscopy (EGD)  Vomiting of blood or coffee ground material  New chest pain or pain under the shoulder blades  Painful or persistently difficult swallowing  New shortness of breath  Fever of 100F or higher  Black, tarry-looking stools  For urgent or emergent issues, a gastroenterologist can be reached at any hour by calling [phone].   DIET:  We do recommend a small meal at first, but then you may proceed to your regular diet.  Drink  plenty of fluids but you should avoid alcoholic beverages for 24 hours.  ACTIVITY:  You should plan to take it easy for the rest of today and you should NOT DRIVE or use heavy machinery until tomorrow (because of the sedation medicines used during the test).    FOLLOW UP: Our staff will call the number listed on your records the next business day following your procedure to check on you and address any questions or concerns that you may have regarding the information given to you following your procedure. If we do not reach you, we will leave a message.  However, if you are feeling well and you are not experiencing any problems, there is no need to return our call.  We will assume that you have returned to your regular daily activities without incident.  If any biopsies were taken you will be contacted by phone or by letter within the next 1-3 weeks.  Please call us at [phone] if you have not heard about the biopsies in 3 weeks.    SIGNATURES/CONFIDENTIALITY: You and/or your care partner have signed paperwork which will be entered into your electronic medical record.  These signatures attest to the fact that that the information above on your After Visit Summary has been reviewed and is understood.  Full responsibility of the confidentiality of this discharge information lies with you and/or your care-partner.  Gastritis, Hiatal hernia and polyp information given.  Post dilation diet  given, instructions reviewed.

## 2016-09-07 NOTE — Op Note (Signed)
[facility] Patient Name: Diane Frye Procedure Date: 09/07/2016 3:03 PM MRN: [id number] Endoscopist: Jerene Bears , MD Age: 56 Referring MD:  Date of Birth: Oct 09, 1960 Gender: Female Account #: [id number] Procedure:                Upper GI endoscopy Indications:              Epigastric abdominal pain (felt related to NSAIDs),                            Dysphagia Medicines:                Monitored Anesthesia Care Procedure:                Pre-Anesthesia Assessment:                           - Prior to the procedure, a History and Physical                            was performed, and patient medications and                            allergies were reviewed. The patient's tolerance of                            previous anesthesia was also reviewed. The risks                            and benefits of the procedure and the sedation                            options and risks were discussed with the patient.                            All questions were answered, and informed consent                            was obtained. Prior Anticoagulants: The patient has                            taken no previous anticoagulant or antiplatelet                            agents. ASA Grade Assessment: II - A patient with                            mild systemic disease. After reviewing the risks                            and benefits, the patient was deemed in                            satisfactory condition to undergo the procedure.  After obtaining informed consent, the endoscope was                            passed under direct vision. Throughout the                            procedure, the patient's blood pressure, pulse, and                            oxygen saturations were monitored continuously. The                            Model GIF-HQ190 [id number]) scope was introduced                            through the mouth, and advanced to the  second part                            of duodenum. The upper GI endoscopy was                            accomplished without difficulty. The patient                            tolerated the procedure well. Scope In: Scope Out: Findings:                 A low-grade of narrowing Schatzki ring (acquired)                            was found at the gastroesophageal junction. A TTS                            dilator was passed through the scope. Dilation with                            a 16-17-18 mm balloon dilator was performed to 16                            mm. The dilation site was examined and showed                            moderate improvement in luminal narrowing.                           Normal mucosa was found in the entire esophagus.                           A 2 cm hiatal hernia was present.                           Patchy mild inflammation characterized by adherent  blood and erythema was found in the gastric antrum.                            Biopsies were taken with a cold forceps for                            histology and Helicobacter pylori testing.                           A single localized erosion without bleeding was                            found in the duodenal bulb.                           The examined duodenum was normal. Complications:            No immediate complications. Estimated Blood Loss:     Estimated blood loss was minimal. Estimated blood                            loss was minimal. Impression:               - Low-grade of narrowing Schatzki ring. Dilated.                           - Normal mucosa was found in the entire esophagus.                           - 2 cm hiatal hernia.                           - Gastritis. Biopsied.                           - Duodenal erosion without bleeding.                           - Normal examined duodenum. Recommendation:           - Patient has a contact number available for                             emergencies. The signs and symptoms of potential                            delayed complications were discussed with the                            patient. Return to normal activities tomorrow.                            Written discharge instructions were provided to the                            patient.                           -  Resume previous diet.                           - Continue present medications.                           - Await pathology results.                           - No aspirin, ibuprofen, naproxen, or other                            non-steroidal anti-inflammatory drugs. Jerene Bears, MD 09/07/2016 3:53:34 PM This report has been signed electronically.

## 2016-09-08 ENCOUNTER — Telehealth: Payer: Self-pay

## 2016-09-08 NOTE — Telephone Encounter (Signed)
   Follow up Call-  Call back number 09/07/2016  Post procedure Call Back phone  # [phone]  Permission to leave phone message Yes  Some recent data might be hidden     Patient questions:  Do you have a fever, pain , or abdominal swelling? No. Pain Score  3 *  Have you tolerated food without any problems? Yes.    Have you been able to return to your normal activities? Yes.    Do you have any questions about your discharge instructions: Diet   No. Medications  Yes.   Follow up visit  Yes.    Do you have questions or concerns about your Care? Yes.    Actions: * If pain score is 4 or above: Physician/ provider Notified : Zenovia Jarred, MD  Pt. Reports concerns over bloating and a dull pulsing sensation under her left rib cage (pt. Had these symptoms prior to her colonoscopy), and wonders what treatment would be helpful.  Wonders if she needs to be on an antibiotic, since stomach lining is inflamed, and she cannot take NSAIDS or steriods.   Marland Kitchen

## 2016-09-08 NOTE — Telephone Encounter (Signed)
Spoke with Dr. Hilarie Fredrickson re: pt.'s concerns.   Called pt. Back to tell her the Protonix should be helpful with her symptoms.   Pt. Has not yet picked it up from her pharmacy.   Told pt. To call Dr. Hilarie Fredrickson should the medication not yield improvement of her symptoms of bloating and dull pulsing sensations under her left rib cage.

## 2016-09-15 ENCOUNTER — Encounter: Payer: Self-pay | Admitting: Internal Medicine

## 2016-10-02 ENCOUNTER — Encounter (HOSPITAL_COMMUNITY): Payer: Self-pay | Admitting: Nurse Practitioner

## 2016-10-02 ENCOUNTER — Emergency Department (HOSPITAL_COMMUNITY)
Admission: EM | Admit: 2016-10-02 | Discharge: 2016-10-02 | Disposition: A | Discharge status: Home / Self Care | Payer: Managed Care, Other (non HMO) | Attending: Emergency Medicine | Admitting: Emergency Medicine

## 2016-10-02 ENCOUNTER — Emergency Department (HOSPITAL_COMMUNITY): Payer: Managed Care, Other (non HMO)

## 2016-10-02 DIAGNOSIS — M5416 Radiculopathy, lumbar region: Secondary | ICD-10-CM | POA: Insufficient documentation

## 2016-10-02 DIAGNOSIS — M545 Low back pain: Secondary | ICD-10-CM | POA: Diagnosis present

## 2016-10-02 DIAGNOSIS — M5410 Radiculopathy, site unspecified: Secondary | ICD-10-CM

## 2016-10-02 DIAGNOSIS — G8918 Other acute postprocedural pain: Secondary | ICD-10-CM | POA: Diagnosis not present

## 2016-10-02 DIAGNOSIS — Z79899 Other long term (current) drug therapy: Secondary | ICD-10-CM | POA: Diagnosis not present

## 2016-10-02 DIAGNOSIS — M549 Dorsalgia, unspecified: Secondary | ICD-10-CM

## 2016-10-02 LAB — CBC WITH DIFFERENTIAL/PLATELET
BASOS ABS: 0 10*3/uL (ref 0.0–0.1)
BASOS PCT: 0 %
EOS ABS: 0.3 10*3/uL (ref 0.0–0.7)
Eosinophils Relative: 2 %
HCT: 37.8 % (ref 36.0–46.0)
Hemoglobin: 12.8 g/dL (ref 12.0–15.0)
Lymphocytes Relative: 43 %
Lymphs Abs: 6.2 10*3/uL — ABNORMAL HIGH (ref 0.7–4.0)
MCH: 29.2 pg (ref 26.0–34.0)
MCHC: 33.9 g/dL (ref 30.0–36.0)
MCV: 86.1 fL (ref 78.0–100.0)
MONO ABS: 1.2 10*3/uL — AB (ref 0.1–1.0)
Monocytes Relative: 8 %
NEUTROS PCT: 47 %
Neutro Abs: 6.7 10*3/uL (ref 1.7–7.7)
PLATELETS: 383 10*3/uL (ref 150–400)
RBC: 4.39 MIL/uL (ref 3.87–5.11)
RDW: 13.1 % (ref 11.5–15.5)
WBC: 14.4 10*3/uL — AB (ref 4.0–10.5)

## 2016-10-02 LAB — I-STAT CHEM 8, ED
BUN: 23 mg/dL — ABNORMAL HIGH (ref 6–20)
CALCIUM ION: 1.18 mmol/L (ref 1.15–1.40)
CHLORIDE: 106 mmol/L (ref 101–111)
Creatinine, Ser: 0.8 mg/dL (ref 0.44–1.00)
Glucose, Bld: 107 mg/dL — ABNORMAL HIGH (ref 65–99)
HCT: 39 % (ref 36.0–46.0)
Hemoglobin: 13.3 g/dL (ref 12.0–15.0)
Potassium: 3.8 mmol/L (ref 3.5–5.1)
SODIUM: 140 mmol/L (ref 135–145)
TCO2: 23 mmol/L (ref 0–100)

## 2016-10-02 MED ORDER — LIDOCAINE 5 % EX PTCH: 1.0000 | MEDICATED_PATCH | Freq: Every day | CUTANEOUS | 0 refills | Status: AC | PRN

## 2016-10-02 MED ORDER — OXYCODONE HCL 5 MG PO TABS: 5.0000 mg | ORAL_TABLET | Freq: Two times a day (BID) | ORAL | 0 refills | Status: AC | PRN

## 2016-10-02 MED ORDER — IOPAMIDOL (ISOVUE-300) INJECTION 61%
INTRAVENOUS | Status: AC
  Filled 2016-10-02: qty 100

## 2016-10-02 MED ORDER — IOPAMIDOL (ISOVUE-300) INJECTION 61%
100.0000 mL | Freq: Once | INTRAVENOUS | Status: AC | PRN
  Administered 2016-10-02: 100 mL via INTRAVENOUS

## 2016-10-02 NOTE — ED Provider Notes (Signed)
[facility] DEPT Provider Note    By signing my name below, I, Bea Graff, attest that this documentation has been prepared under the direction and in the presence of Everlene Balls, MD. Electronically Signed: Bea Graff, ED Scribe. 10/02/16. 4:19 AM.    History   Chief Complaint Chief Complaint  Patient presents with  . Back Pain  . Post-Procedual Complications    The history is provided by the patient and medical records. No language interpreter was used.    HPI Comments:  Diane Frye is a 55 y.o. female who presents to the Emergency Department complaining of post procedural complications that began four days ago. Pt states she had a cortisone injection for the first time to her L4 vertebrae four days ago. She reports she then began having a subjective fever and muscle spasms of the upper left leg and pain that goes down into the left foot. Pt states she called her neurologist and was prescribed a muscle relaxer that she reports taking with no significant relief. She reports she is now experiencing suprapubic abdominal pressure, upper left leg muscle spasms and a "knot" to the upper left hip. She reports associated urinary hesitancy and states it does not feel like she empties her bladder completely. She has not taken anything other than the muscle relaxer for her symptoms but does reports applying ice packs to the area on the posterior hip. She denies modifying factors. She denies urinary incontinence, nausea, vomiting.   Past Medical History:  Diagnosis Date  . Allergy    year round allergy with sinus drainage. Drugs of choice are decongestants  . Anxiety   . Chronic headaches    2-3 times a month, frontal location, pressure like pain.  . Clavicle fracture   . Climacteric   . Drug-induced leukopenia (HCC)    antibiotic induced - 14 day hospitalization 1990's  . Eustachian tube dysfunction    right ore than left  . Fractured pelvis ([place])   . Myalgia   .  Sleep apnea   . Varicella     Patient Active Problem List   Diagnosis Date Noted  . Cervical dysplasia 10/02/2012    Class: Present on Admission  . S/P LEEP of cervix 10/02/2012    Class: Status post  . Chronic headaches   . Eustachian tube dysfunction   . Climacteric   . Myalgia     Past Surgical History:  Procedure Laterality Date  . ANTERIOR CRUCIATE LIGAMENT REPAIR     right knee, secondary trauma w/ ACL repair at 55 y/o  . GoPo    . LEEP  10/02/2012   Procedure: LOOP ELECTROSURGICAL EXCISION PROCEDURE (LEEP);  Surgeon: Darlyn Chamber, MD;  Location: [facility];  Service: Gynecology;  Laterality: N/A;  . ovarian cystectomy     left, laproscopically    OB History    No data available       Home Medications    Prior to Admission medications   Medication Sig Start Date End Date Taking? Authorizing Provider  ALPRAZolam Duanne Moron) 1 MG tablet Take 1 mg by mouth daily. 10/06/15   Historical Provider, MD  dextromethorphan-guaiFENesin (MUCINEX DM) 30-600 MG 12hr tablet Take 1 tablet by mouth 2 (two) times daily as needed for cough.    Historical Provider, MD  ESTRACE VAGINAL 0.1 MG/GM vaginal cream  09/07/16   Historical Provider, MD  ibuprofen (ADVIL,MOTRIN) 200 MG tablet Take 200 mg by mouth every 6 (six) hours as needed.    Historical Provider,  MD  levocetirizine (XYZAL) 5 MG tablet Take 5 mg by mouth daily as needed for allergies.     Historical Provider, MD  MINIVELLE 0.075 MG/24HR  09/05/16   Historical Provider, MD  mometasone (NASONEX) 50 MCG/ACT nasal spray Place 2 sprays into the nose daily as needed. For allergies    Historical Provider, MD  Olopatadine HCl 0.2 % SOLN  09/02/16   Historical Provider, MD  pantoprazole (PROTONIX) 40 MG tablet Take 1 tablet (40 mg total) by mouth daily. 09/07/16   Jerene Bears, MD  progesterone (PROMETRIUM) 100 MG capsule  09/07/16   Historical Provider, MD  pseudoephedrine (SUDAFED) 30 MG tablet Take 30 mg by mouth every 4 (four) hours as needed  for congestion.    Historical Provider, MD  tacrolimus (PROTOPIC) 0.1 % ointment APPLY AND GENTLY MASSAGE INTO AFFECTED AREA(S) TWICE DAILY. 08/12/16   Historical Provider, MD  triamterene-hydrochlorothiazide (MAXZIDE-25) 37.5-25 MG per tablet Take 1 tablet by mouth as needed. For diuresis/ edema.    Historical Provider, MD  XIIDRA 5 % SOLN  09/05/16   Historical Provider, MD  zolpidem (AMBIEN CR) 12.5 MG CR tablet Take 12.5 mg by mouth at bedtime as needed for sleep.     Historical Provider, MD    Family History Family History  Problem Relation Age of Onset  . Hypertension Mother   . Hyperlipidemia Mother   . Arthritis Mother     ankle and hip  . Heart disease Father     MI/CAD  . COPD Father     lung scarring/COPD  . Hypertension Father   . Hyperlipidemia Father   . Diabetes Father   . Heart disease Brother     MI/PCA-stent  . Hyperlipidemia Brother   . Hypertension Brother   . Obesity Brother   . Cancer Neg Hx     Social History Social History  Substance Use Topics  . Smoking status: Never Smoker  . Smokeless tobacco: Never Used  . Alcohol use 1.0 oz/week    2 Standard drinks or equivalent per week     Comment: social     Allergies   Codeine and Penicillins   Review of Systems Review of Systems A complete 10 system review of systems was obtained and all systems are negative except as noted in the HPI and PMH.    Physical Exam Updated Vital Signs BP (!) 155/106 (BP Location: Left Arm)   Pulse 105   Temp 97.6 F (36.4 C) (Oral)   Resp 18   SpO2 100%   Physical Exam  Constitutional: She is oriented to person, place, and time. She appears well-developed and well-nourished. No distress.  HENT:  Head: Normocephalic and atraumatic.  Nose: Nose normal.  Mouth/Throat: Oropharynx is clear and moist. No oropharyngeal exudate.  Eyes: Conjunctivae and EOM are normal. Pupils are equal, round, and reactive to light. No scleral icterus.  Neck: Normal range of  motion. Neck supple. No JVD present. No tracheal deviation present. No thyromegaly present.  Cardiovascular: Normal rate, regular rhythm and normal heart sounds.  Exam reveals no gallop and no friction rub.   No murmur heard. Pulmonary/Chest: Effort normal and breath sounds normal. No respiratory distress. She has no wheezes. She exhibits no tenderness.  Abdominal: Soft. Bowel sounds are normal. She exhibits no distension and no mass. There is no tenderness. There is no rebound and no guarding.  Musculoskeletal: Normal range of motion. She exhibits no edema or tenderness.  Lymphadenopathy:    She has  no cervical adenopathy.  Neurological: She is alert and oriented to person, place, and time. No cranial nerve deficit. She exhibits normal muscle tone.  Normal strength and sensation in all extremities. Normal cerebellar testing. Normal gait.  Skin: Skin is warm and dry. No rash noted. No erythema. No pallor.  Nursing note and vitals reviewed.    ED Treatments / Results   DIAGNOSTIC STUDIES: Oxygen Saturation is 100% on RA, normal by my interpretation.   COORDINATION OF CARE: 3:59 AM- Will prescribe Tramadol and Lidocaine patches. Return precautions discussed. Pt verbalizes understanding and agrees to plan.  Medications  iopamidol (ISOVUE-300) 61 % injection (not administered)  iopamidol (ISOVUE-300) 61 % injection 100 mL (100 mLs Intravenous Contrast Given 10/02/16 0217)   Labs (all labs ordered are listed, but only abnormal results are displayed) Labs Reviewed  CBC WITH DIFFERENTIAL/PLATELET - Abnormal; Notable for the following:       Result Value   WBC 14.4 (*)    Lymphs Abs 6.2 (*)    Monocytes Absolute 1.2 (*)    All other components within normal limits  I-STAT CHEM 8, ED - Abnormal; Notable for the following:    BUN 23 (*)    Glucose, Bld 107 (*)    All other components within normal limits  PATHOLOGIST SMEAR REVIEW    EKG  EKG Interpretation None        Radiology Ct Lumbar Spine W Contrast  Result Date: 10/02/2016 CLINICAL DATA:  Status post lumbar spine injection at L4 September 28, 2016. Sacral knot and numbness inner thigh. Suprapubic pressure. Assess for spinal hematoma. EXAM: CT LUMBAR SPINE WITH CONTRAST TECHNIQUE: Multidetector CT imaging of the lumbar spine was performed with intravenous contrast administration. CONTRAST:  134mL ISOVUE-300 IOPAMIDOL (ISOVUE-300) INJECTION 61% COMPARISON:  MRI of the lumbar spine August 16, 2016 FINDINGS: SEGMENTATION: For the purposes of this report the last well-formed intervertebral disc space is reported as L5-S1. ALIGNMENT: Vertebral bodies in alignment, maintenance of the lumbar lordosis. VERTEBRAE: Vertebral bodies and posterior elements are intact. Mild L4-5 disc height loss and endplate sclerosis compatible with degenerative disc as seen on recent MRI. No destructive bony lesions. PARASPINAL AND OTHER SOFT TISSUES: Included prevertebral and paraspinal soft tissues are unremarkable. Preservation of the epidural fat. DISC LEVELS: T11-12, T12-L1, L1-2, L2-3: No disc bulge, canal stenosis nor neural foraminal narrowing. L3-4: Similar small LEFT extraforaminal disc protrusion. No canal stenosis. Mild LEFT neural foraminal narrowing. L4-5: Small broad-based disc bulge. Mild facet arthropathy and ligamentum flavum redundancy without canal stenosis. Mild bilateral neural foraminal narrowing. L5-S1: Moderate RIGHT subarticular disc protrusion. Mild facet arthropathy and ligamentum flavum redundancy without canal stenosis. Mild RIGHT neural foraminal narrowing. IMPRESSION: No acute process in the lumbar spine, specifically no focal fluid collection or hematoma. Stable degenerative change of lumbar spine without canal stenosis. Mild L3-4 through L5-S1 neural foraminal narrowing. Electronically Signed   By: Elon Alas M.D.   On: 10/02/2016 03:10    Procedures Procedures (including critical care  time)  Medications Ordered in ED Medications  iopamidol (ISOVUE-300) 61 % injection (not administered)  iopamidol (ISOVUE-300) 61 % injection 100 mL (100 mLs Intravenous Contrast Given 10/02/16 0217)     Initial Impression / Assessment and Plan / ED Course  I have reviewed the triage vital signs and the nursing notes.  Pertinent labs & imaging results that were available during my care of the patient were reviewed by me and considered in my medical decision making (see chart for details).  Clinical  Course    Patient presents to the ED at the referral of her physician for CT of her L spine to evaluate for hematoma.  This was negative.  Patient may have some injury to her nerve as a result of her procedure.  Education provided.  Advised on ice packs, lidocaine patches, and oxycodone for serve breakthrough pain.  She may also have some effect to her bladder as well causing mild hesitancy.  No severe abdominal pain and she has been voiding.  This should resolve with time.  She demonstrates good understanding.  PCP fu advised within 3 days.  She appears well and in NAD.  Vs remain within her normal limits and she Is safe for DC.     I personally performed the services described in this documentation, which was scribed in my presence. The recorded information has been reviewed and is accurate.     Final Clinical Impressions(s) / ED Diagnoses   Final diagnoses:  Back pain    New Prescriptions New Prescriptions   No medications on file     Everlene Balls, MD 10/02/16 [phone]

## 2016-10-02 NOTE — ED Triage Notes (Addendum)
Pt reportedly had a cortisone shot to her L4 from the pain specialist on the 27th. Since then she has gradually experience lightheadedness, facial flushing, dizziness and numbness to her inner thigh and a knot in sacral area that she describes "sitting on an apple." Called the neurologist office on call and after she described her symptoms, she was advised to come in for a spine CT to rule in/out spine hematoma. She is also reporting symptoms of urinary symptoms of somewhat retaining urine despite increased fluid intake. Also c/o suprapubic pressure.

## 2016-10-04 LAB — PATHOLOGIST SMEAR REVIEW

## 2017-01-20 ENCOUNTER — Other Ambulatory Visit: Payer: Self-pay | Admitting: Obstetrics and Gynecology

## 2017-01-20 DIAGNOSIS — N63 Unspecified lump in unspecified breast: Secondary | ICD-10-CM

## 2017-01-25 ENCOUNTER — Ambulatory Visit
Admission: RE | Admit: 2017-01-25 | Discharge: 2017-01-25 | Disposition: A | Discharge status: Home / Self Care | Payer: Managed Care, Other (non HMO) | Source: Ambulatory Visit | Attending: Obstetrics and Gynecology | Admitting: Obstetrics and Gynecology

## 2017-01-25 ENCOUNTER — Other Ambulatory Visit: Payer: Self-pay | Admitting: Obstetrics and Gynecology

## 2017-01-25 DIAGNOSIS — N63 Unspecified lump in unspecified breast: Secondary | ICD-10-CM

## 2018-02-15 ENCOUNTER — Other Ambulatory Visit: Payer: Self-pay | Admitting: Obstetrics and Gynecology

## 2018-02-15 DIAGNOSIS — Z1231 Encounter for screening mammogram for malignant neoplasm of breast: Secondary | ICD-10-CM

## 2018-03-09 ENCOUNTER — Ambulatory Visit: Payer: Managed Care, Other (non HMO)

## 2018-03-30 ENCOUNTER — Ambulatory Visit: Payer: Self-pay

## 2018-09-15 IMAGING — CT CT L SPINE W/ CM
3 of 4 series · 13 of 33 positions shown, 16 images · IV contrast (ISOVUE)
Comparison: MRI of the lumbar spine August 16, 2016

CLINICAL DATA: Status post lumbar spine injection at L05 September, 2016. Sacral knot and numbness inner thigh. Suprapubic pressure.
Assess for spinal hematoma.

EXAM:
CT LUMBAR SPINE WITH CONTRAST
TECHNIQUE: Multidetector CT imaging of the lumbar spine was performed with
intravenous contrast administration.
CONTRAST:  100mL VG92JH-YSS IOPAMIDOL (VG92JH-YSS) INJECTION 61%

[Series 3: l-spine · axial · 0.27mm/px · z∈[-292,-128]mm · 5 of 122 slices shown, 7 images]
[im 21/122  soft-tissue]
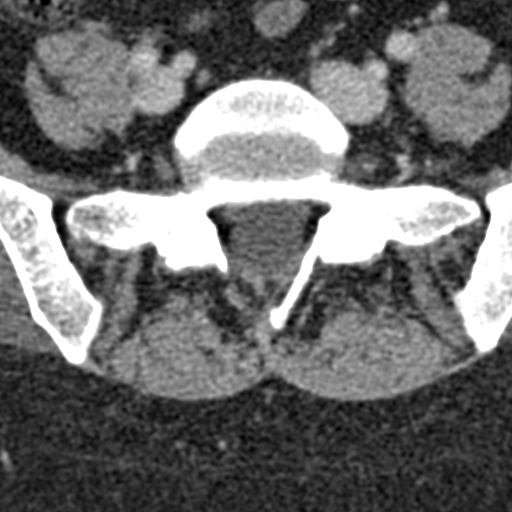
[im 21/122  bone]
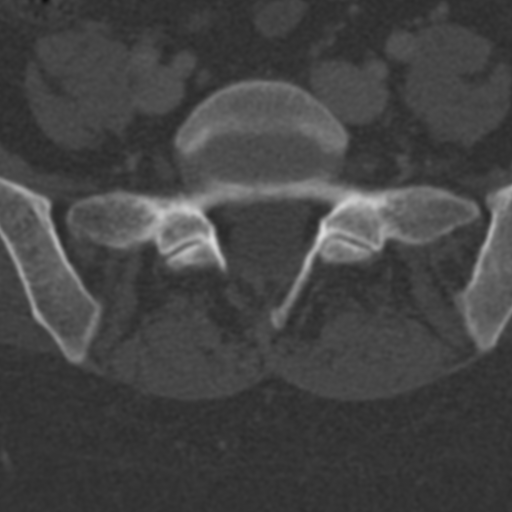
[im 41/122  bone]
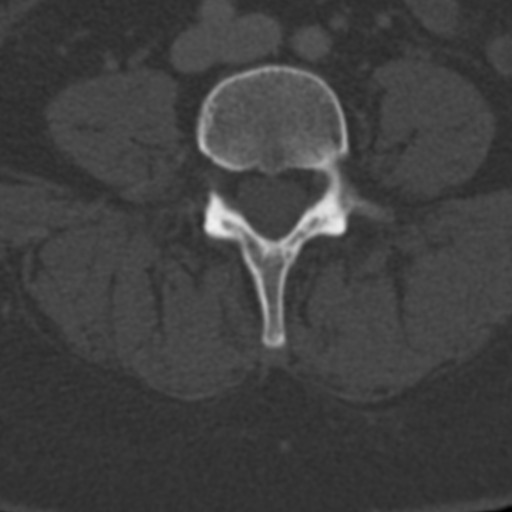
[im 61/122  bone]
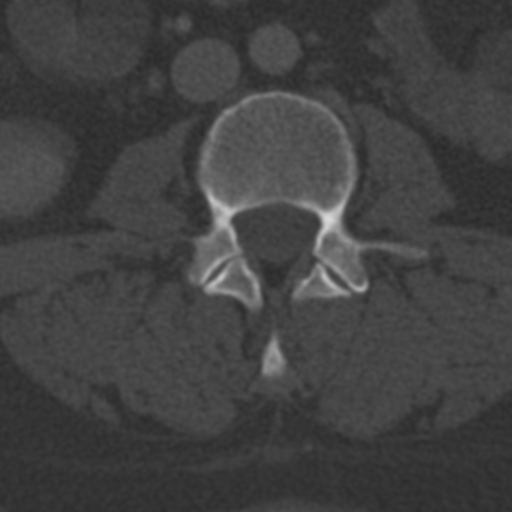
[im 81/122  bone]
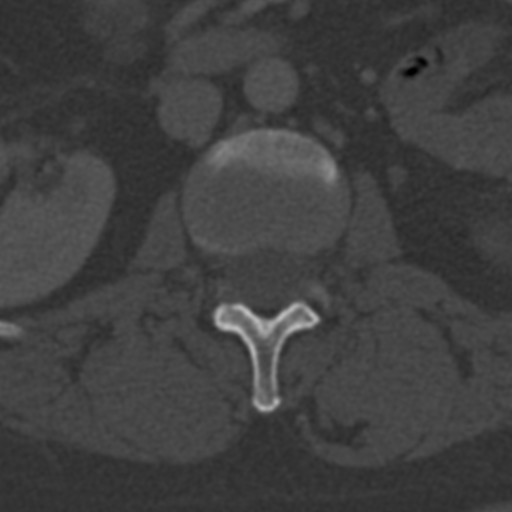
[im 101/122  soft-tissue]
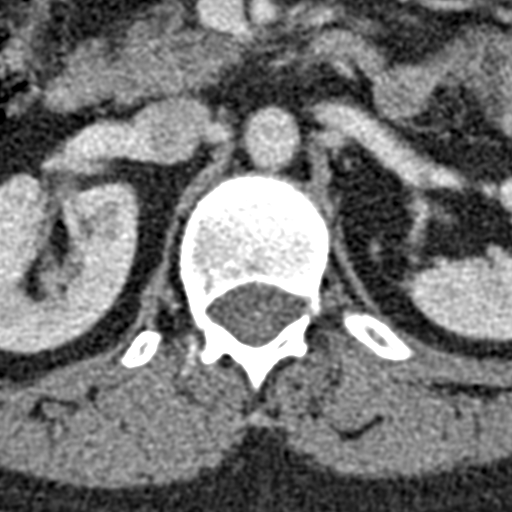
[im 101/122  bone]
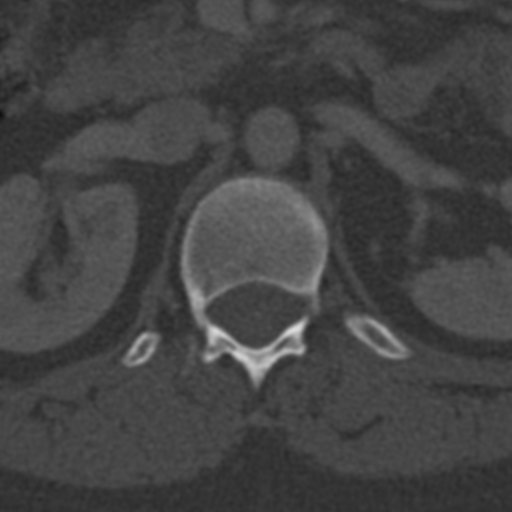

[Series 7: coronal · coronal · 0.36mm/px · 3 of 61 slices shown]
[im 13/61  bone]
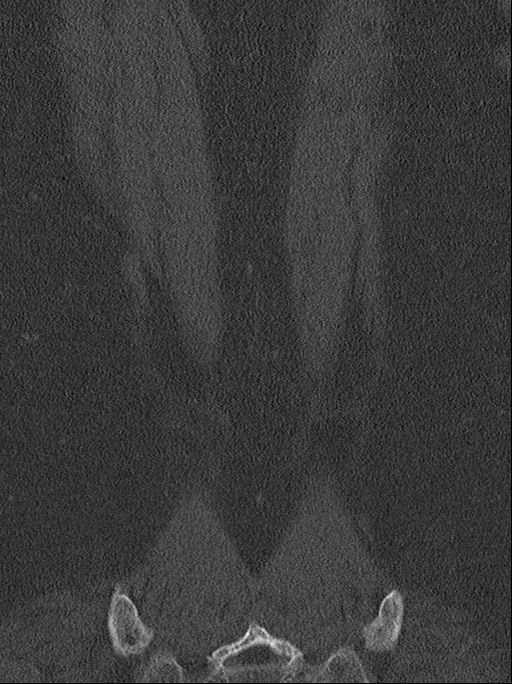
[im 25/61  bone]
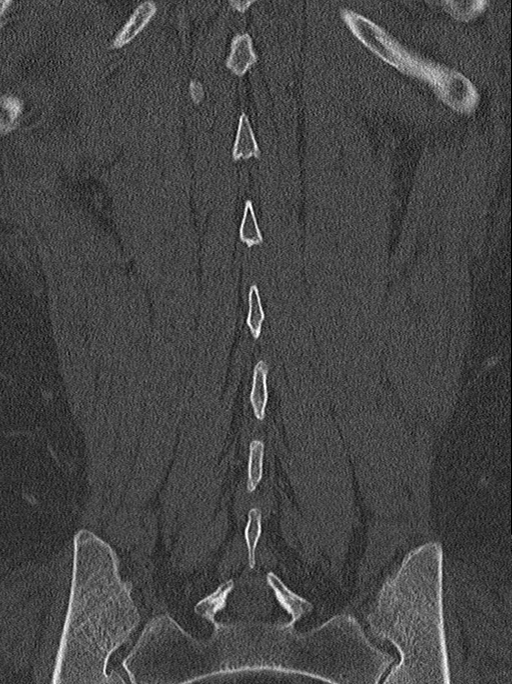
[im 37/61  bone]
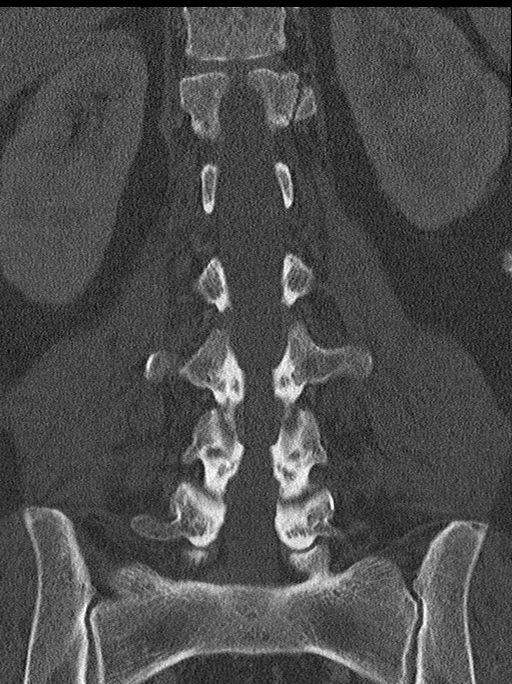

[Series 10: sagittal st · sagittal · 0.36mm/px · 5 of 61 slices shown, 6 images]
[im 21/61  bone]
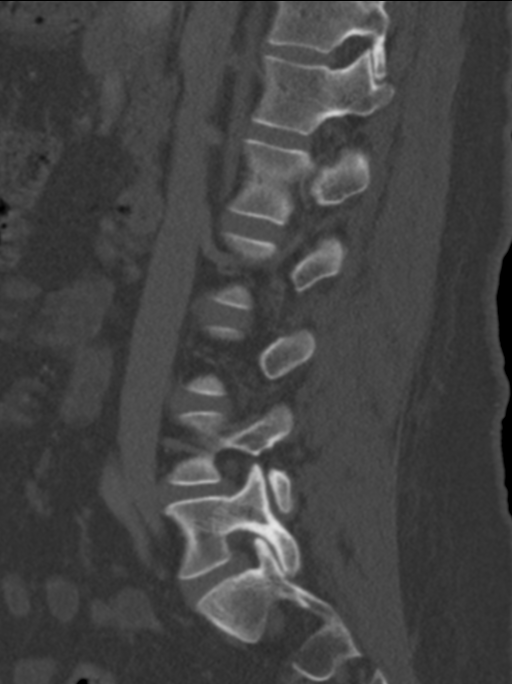
[im 26/61  bone]
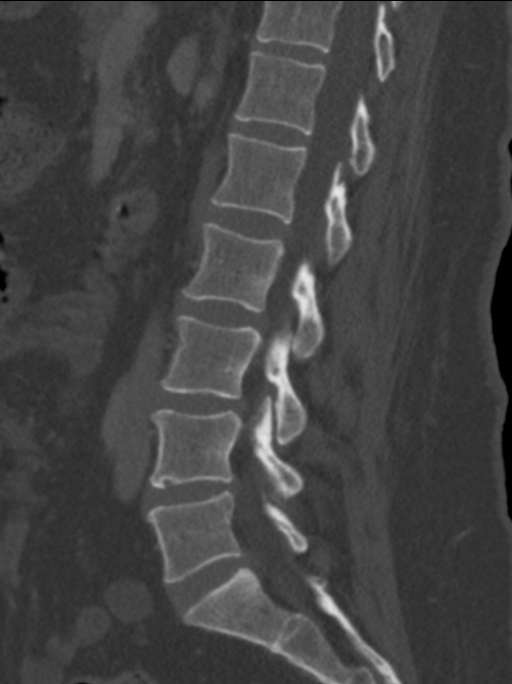
[im 31/61  soft-tissue]
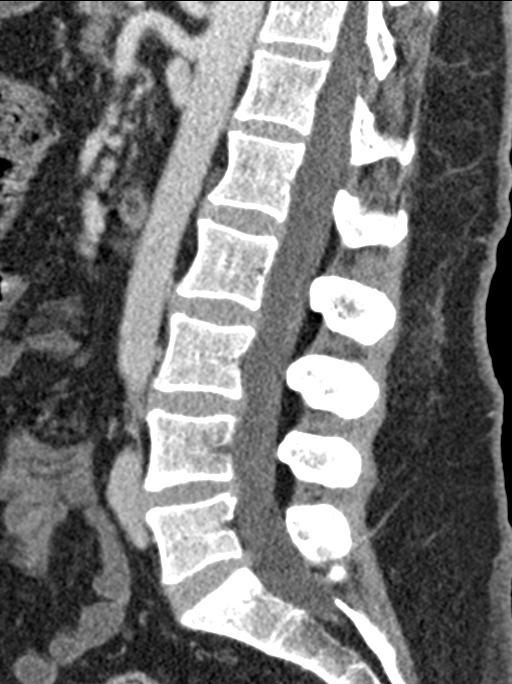
[im 31/61  bone]
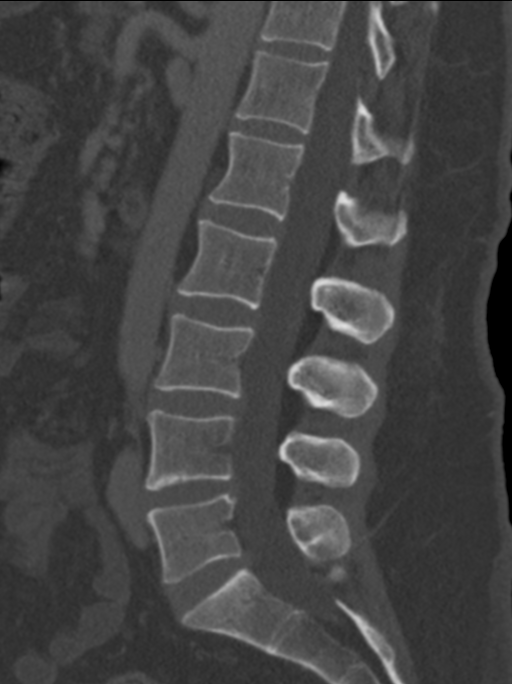
[im 36/61  bone]
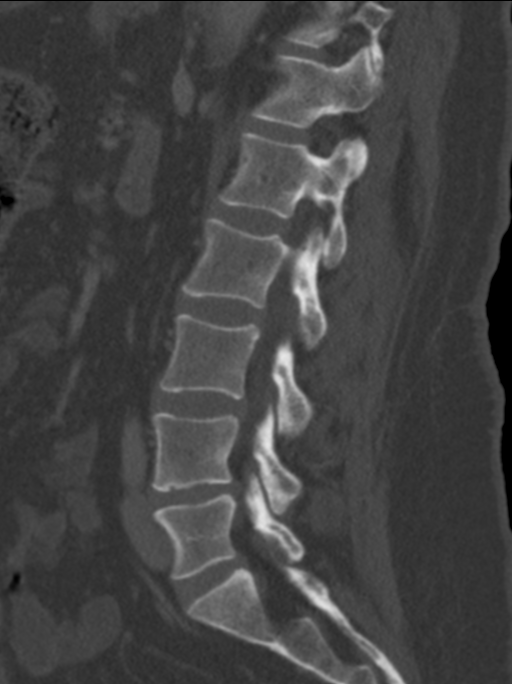
[im 41/61  bone]
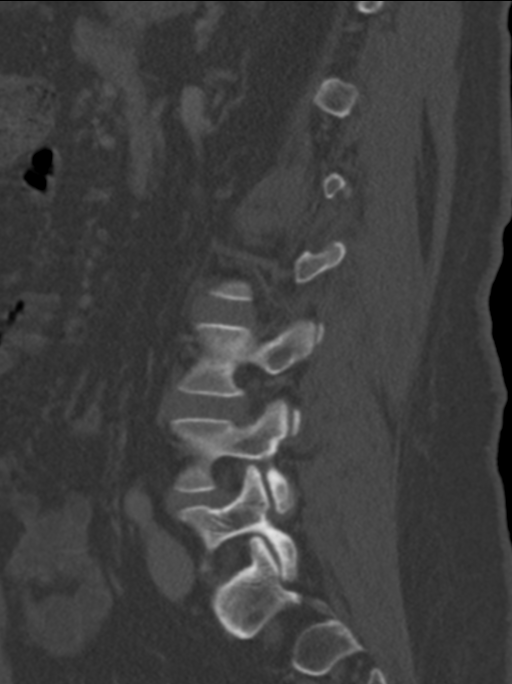

[13 of 33 positions shown; findings below may reference images not displayed]

FINDINGS: SEGMENTATION: For the purposes of this report the last well-formed
intervertebral disc space is reported as L5-S1.

ALIGNMENT: Vertebral bodies in alignment, maintenance of the lumbar
lordosis.

VERTEBRAE: Vertebral bodies and posterior elements are intact. Mild
L4-5 disc height loss and endplate sclerosis compatible with
degenerative disc as seen on recent MRI. No destructive bony
lesions.

PARASPINAL AND OTHER SOFT TISSUES: Included prevertebral and
paraspinal soft tissues are unremarkable. Preservation of the
epidural fat.

DISC LEVELS:

T11-12, T12-L1, L1-2, L2-3: No disc bulge, canal stenosis nor neural
foraminal narrowing.

L3-4: Similar small LEFT extraforaminal disc protrusion. No canal
stenosis. Mild LEFT neural foraminal narrowing.

L4-5: Small broad-based disc bulge. Mild facet arthropathy and
ligamentum flavum redundancy without canal stenosis. Mild bilateral
neural foraminal narrowing.

L5-S1: Moderate RIGHT subarticular disc protrusion. Mild facet
arthropathy and ligamentum flavum redundancy without canal stenosis.
Mild RIGHT neural foraminal narrowing.
IMPRESSION: No acute process in the lumbar spine, specifically no focal fluid
collection or hematoma.

Stable degenerative change of lumbar spine without canal stenosis.
Mild L3-4 through L5-S1 neural foraminal narrowing.

## 2019-02-23 MED FILL — ZOLPIDEM TART ER 12.5 MG TA: 12.5 | 30 days supply | Qty: 30 | Fill #0

## 2019-03-23 MED FILL — ZOLPIDEM TART ER 12.5 MG TA: 12.5 | 30 days supply | Qty: 30 | Fill #1

## 2019-03-25 MED FILL — TRIAMTERENE/HCTZ 37.5/25 CP: 37.5-25 | 60 days supply | Qty: 30 | Fill #0

## 2019-03-27 MED FILL — ALPRAZolam 1 MG TABS: 1 | 90 days supply | Qty: 90 | Fill #0

## 2019-04-19 MED FILL — ZOLPIDEM TART ER 12.5 MG TA: 12.5 | 30 days supply | Qty: 30 | Fill #2

## 2019-05-18 MED FILL — ZOLPIDEM TART ER 12.5 MG TA: 12.5 | 30 days supply | Qty: 30 | Fill #3

## 2019-06-13 MED FILL — PROGESTERONE 100 MG CAPSULE: 100 | 90 days supply | Qty: 90 | Fill #0

## 2019-06-14 MED FILL — ZOLPIDEM TART ER 12.5 MG TA: 12.5 | 90 days supply | Qty: 90 | Fill #0

## 2019-06-24 MED FILL — ALPRAZolam 1 MG TABS: 1 | 90 days supply | Qty: 90 | Fill #1

## 2021-09-07 ENCOUNTER — Encounter: Payer: Self-pay | Admitting: Internal Medicine
